# Patient Record
Sex: Female | Born: 1995 | Race: Black or African American | Hispanic: No | Marital: Single | Smoking: Never smoker
Health system: Southern US, Community
[De-identification: ages and names within clinical notes are randomized; demographics above are authoritative.]

## PROBLEM LIST (undated history)

## (undated) DIAGNOSIS — K011 Impacted teeth: Secondary | ICD-10-CM

## (undated) DIAGNOSIS — R519 Headache, unspecified: Secondary | ICD-10-CM

## (undated) DIAGNOSIS — F429 Obsessive-compulsive disorder, unspecified: Secondary | ICD-10-CM

## (undated) DIAGNOSIS — Z973 Presence of spectacles and contact lenses: Secondary | ICD-10-CM

## (undated) DIAGNOSIS — I10 Essential (primary) hypertension: Secondary | ICD-10-CM

## (undated) DIAGNOSIS — D649 Anemia, unspecified: Secondary | ICD-10-CM

## (undated) DIAGNOSIS — F319 Bipolar disorder, unspecified: Secondary | ICD-10-CM

## (undated) DIAGNOSIS — F952 Tourette's disorder: Secondary | ICD-10-CM

## (undated) DIAGNOSIS — IMO0002 Reserved for concepts with insufficient information to code with codable children: Secondary | ICD-10-CM

## (undated) DIAGNOSIS — IMO0001 Reserved for inherently not codable concepts without codable children: Secondary | ICD-10-CM

## (undated) DIAGNOSIS — R200 Anesthesia of skin: Secondary | ICD-10-CM

## (undated) DIAGNOSIS — N920 Excessive and frequent menstruation with regular cycle: Secondary | ICD-10-CM

## (undated) DIAGNOSIS — E119 Type 2 diabetes mellitus without complications: Secondary | ICD-10-CM

## (undated) DIAGNOSIS — Z6841 Body Mass Index (BMI) 40.0 and over, adult: Secondary | ICD-10-CM

## (undated) DIAGNOSIS — R202 Paresthesia of skin: Secondary | ICD-10-CM

## (undated) DIAGNOSIS — K029 Dental caries, unspecified: Secondary | ICD-10-CM

## (undated) DIAGNOSIS — R51 Headache: Secondary | ICD-10-CM

---
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
## 2014-11-27 ENCOUNTER — Emergency Department (HOSPITAL_COMMUNITY)
Admission: EM | Admit: 2014-11-27 | Discharge: 2014-11-27 | Disposition: A | Discharge status: Home / Self Care | Payer: Medicaid Other | Attending: Emergency Medicine | Admitting: Emergency Medicine

## 2014-11-27 ENCOUNTER — Encounter (HOSPITAL_COMMUNITY): Payer: Self-pay | Admitting: Family Medicine

## 2014-11-27 DIAGNOSIS — Z8742 Personal history of other diseases of the female genital tract: Secondary | ICD-10-CM | POA: Diagnosis not present

## 2014-11-27 DIAGNOSIS — E6609 Other obesity due to excess calories: Secondary | ICD-10-CM | POA: Insufficient documentation

## 2014-11-27 DIAGNOSIS — Z862 Personal history of diseases of the blood and blood-forming organs and certain disorders involving the immune mechanism: Secondary | ICD-10-CM | POA: Diagnosis not present

## 2014-11-27 DIAGNOSIS — M5416 Radiculopathy, lumbar region: Secondary | ICD-10-CM | POA: Diagnosis not present

## 2014-11-27 DIAGNOSIS — M545 Low back pain: Secondary | ICD-10-CM | POA: Diagnosis present

## 2014-11-27 MED ORDER — OXYCODONE-ACETAMINOPHEN 5-325 MG PO TABS
1.0000 | ORAL_TABLET | Freq: Once | ORAL | Status: AC
  Administered 2014-11-27: 1 via ORAL

## 2014-11-27 MED ORDER — PREDNISONE 50 MG PO TABS: ORAL_TABLET | ORAL | Status: DC

## 2014-11-27 MED ORDER — KETOROLAC TROMETHAMINE 30 MG/ML IJ SOLN
30.0000 mg | Freq: Once | INTRAMUSCULAR | Status: AC
  Administered 2014-11-27: 30 mg via INTRAVENOUS
  Filled 2014-11-27: qty 1

## 2014-11-27 MED ORDER — METHYLPREDNISOLONE SODIUM SUCC 125 MG IJ SOLR
125.0000 mg | Freq: Once | INTRAMUSCULAR | Status: AC
  Administered 2014-11-27: 125 mg via INTRAVENOUS
  Filled 2014-11-27: qty 2

## 2014-11-27 MED ORDER — DIAZEPAM 5 MG PO TABS: 5.0000 mg | ORAL_TABLET | Freq: Four times a day (QID) | ORAL | Status: DC | PRN

## 2014-11-27 MED ORDER — DIAZEPAM 5 MG/ML IJ SOLN
5.0000 mg | Freq: Once | INTRAMUSCULAR | Status: AC
  Administered 2014-11-27: 5 mg via INTRAVENOUS
  Filled 2014-11-27: qty 2

## 2014-11-27 MED ORDER — OXYCODONE-ACETAMINOPHEN 5-325 MG PO TABS
ORAL_TABLET | ORAL | Status: AC
  Filled 2014-11-27: qty 1

## 2014-11-27 MED ORDER — MORPHINE SULFATE (PF) 4 MG/ML IV SOLN
4.0000 mg | Freq: Once | INTRAVENOUS | Status: AC
  Administered 2014-11-27: 4 mg via INTRAVENOUS
  Filled 2014-11-27: qty 1

## 2014-11-27 MED ORDER — OXYCODONE-ACETAMINOPHEN 5-325 MG PO TABS: ORAL_TABLET | ORAL | Status: DC

## 2014-11-27 NOTE — Discharge Instructions (Signed)
Please take ibuprofen  (this is normally 2 over the counter pills) every 6 hours (take with food to minimze stomach irritation).   Take valium and/or percocet for breakthrough pain, do not drink alcohol, drive, care for children or perfom other critical tasks while taking valium and/or percocet.  Do not hesitate to return to the emergency room for any new, worsening or concerning symptoms.  Please obtain primary care using resource guide below. Let them know that you were seen in the emergency room and that they will need to obtain records for further outpatient management.    Emergency Department Resource Guide 1) Find a Doctor and Pay Out of Pocket Although you won't have to find out who is covered by your insurance plan, it is a good idea to ask around and get recommendations. You will then need to call the office and see if the doctor you have chosen will accept you as a new patient and what types of options they offer for patients who are self-pay. Some doctors offer discounts or will set up payment plans for their patients who do not have insurance, but you will need to ask so you aren't surprised when you get to your appointment.  2) Contact Your Local Health Department Not all health departments have doctors that can see patients for sick visits, but many do, so it is worth a call to see if yours does. If you don't know where your local health department is, you can check in your phone book. The CDC also has a tool to help you locate your state's health department, and many state websites also have listings of all of their local health departments.  3) Find a Walk-in Clinic If your illness is not likely to be very severe or complicated, you may want to try a walk in clinic. These are popping up all over the country in pharmacies, drugstores, and shopping centers. They're usually staffed by nurse practitioners or physician assistants that have been trained to treat common illnesses and  complaints. They're usually fairly quick and inexpensive. However, if you have serious medical issues or chronic medical problems, these are probably not your best option.  No Primary Care Doctor: - Call Health Connect at  [phone] - they can help you locate a primary care doctor that  accepts your insurance, provides certain services, etc. - Physician Referral Service- [phone]  Chronic Pain Problems: Organization         Address  Phone   Notes  [facility]  [phone] Patients need to be referred by their primary care doctor.   Medication Assistance: Organization         Address  Phone   Notes  [facility] [address] [place], [place] [address] [phone] --Must be a resident of [facility] -- Must have NO insurance coverage whatsoever (no Medicaid/ Medicare, etc.) -- The pt. MUST have a primary care doctor that directs their care regularly and follows them in the community   [facility]  [phone]   [employer]  [phone]    Agencies that provide inexpensive medical care: Organization         Address  Phone   Notes  [facility]  [phone]   [facility]    [phone]   [facility] [address] [place], [place] [address] [phone]   [facility] [address] [place]  [address], [place] [phone]   Planned Parenthood    [phone]   [facility]    [phone]   [place] and [facility]  [address], [place] Phone:  [phone], Fax:  [phone] Hours of Operation:  9 am - 6 pm, M-F.  Also accepts Medicaid/Medicare and self-pay.  [facility] for [place], [address], [place] Phone: [phone], Fax: [phone]. Hours of Operation:  8:30 am - 5:30 pm, M-F.  Also accepts Medicaid and self-pay.  [facility] [address], [place] Phone: [phone]   [place], [place], [place] [phone], Ext. [phone] Mondays & Thursdays: 7-9 AM.  First 15 patients are seen on a first come, first serve basis.    [place] Providers:  Organization         Address  Phone   Notes  [facility] [address], [place] [phone] Also accepts self-pay patients.  [facility] [id number] [place], [place]  [phone]   [place], [address], [place] [phone]   [facility] [address] [place] [phone]   Lucianne Lei [address], [place]   [phone] Only accepts [place] Access [place] patients after they have their name applied to their card.   Self-Pay (no insurance) in [facility]:  Organization         Address  Phone   Notes  Sickle Cell Patients, [facility] [place] [phone]   [facility] [place] [phone]   [facility]  [place], [place], [place] [phone]   [facility]/Dr. Osei-Bonsu  [address], [place] or [address], [place] [phone] Phone number for both [place] and [place] locations is the same.  [facility] [address], [address] [phone]   [facility] [address] [place] or [address] [phone]   [facility] [address] [place] [phone], phone; [phone], fax Sees patients 1st and 3rd Saturday of every month.  Must not qualify for public or private insurance (i.e. Medicaid, Medicare, [place] Health Choice, Veterans' Benefits)  Household income should be no more than 200% of the poverty level  The clinic cannot treat you if you are pregnant or think you are pregnant  Sexually transmitted diseases are not treated at the clinic.    Dental Care: Organization         Address  Phone  Notes  [facility] [place] [phone] Accepts children up to age 42 who are enrolled in [place] or [place]; pregnant women with a Medicaid card; and children who have applied for Medicaid or [place] Health Choice, but were declined, whose parents can pay a reduced fee at time of service.  [facility]  [address], [place] [phone] Accepts children up to age 78 who are enrolled in [place] or [place]; pregnant women with a Medicaid card; and children who have applied for Medicaid or  [place] Health Choice, but were declined, whose parents can pay a reduced fee at time of service.  [place] Adult Dental Access PROGRAM  [place] [phone] Patients are seen by appointment only. Walk-ins are not accepted. [place] will see patients 31 years of age and older. Monday - Tuesday (8am-5pm) Most Wednesdays (8:30-5pm) $30 per visit, cash only  [facility] Adult Dental Access PROGRAM  [address], [facility] [phone] Patients are seen by appointment only. Walk-ins are not accepted. [place] will see patients 60 years of age and older. One Wednesday Evening (Monthly: Volunteer Based).  $30 per visit, cash only  [place]  [phone] for adults; Children under age 1, call Graduate Pediatric Dentistry at [phone]. Children aged 26-14, please call [phone] to request a pediatric application.  Dental services are provided in all areas of dental care including fillings, crowns and bridges, complete and partial dentures, implants, gum treatment, root canals, and extractions. Preventive care is  also provided. Treatment is provided to both adults and children. Patients are selected via a lottery and there is often a waiting list.   [facility] [address], [place]  [phone] [email or web address]   [facility] [address] [place], [place] [phone], Ext. [phone] Second and Fourth Thursday of each month, opens at 6:30 AM; Clinic ends at 9 AM.  Patients are seen on a first-come first-served basis, and a limited number are seen during each clinic.   [facility]  [address] Hillard Danker [place], [place] [phone]   Eligibility Requirements You must have lived in [place], [place], or [place] counties for at least the last three months.   You cannot be eligible for state or federal sponsored [employer], including [employer], [place], or [employer].   You generally cannot be eligible for healthcare insurance through your employer.    How to apply: Eligibility screenings are held every Tuesday and Wednesday afternoon from 1:00 pm until 4:00 pm. You do not need an appointment for the interview!  [facility] [address], [place], [place]   [place]  [place] Department  [place]  [phone]    Behavioral Health Resources in the Community: Intensive Outpatient Programs Organization         Address  Phone  Notes  [place] [facility] [place]. [address], [place], [place] [phone]   [facility] [address] [place], [place]   ADS: [facility] [address] [place], [place]   [place] [address],  [place], [place] or [phone]   Substance Abuse Resources Organization         Address  Phone  Notes  Alcohol and Drug Services  [phone]   [place]  [phone]   [facility] [place]   Chinita Pester  [phone]   Residential & Outpatient Substance Abuse Program  [phone]   Psychological Services Organization         Address  Phone  Notes  [facility]  [place]  [phone]   [place] [address] [place] or [phone]    Mobile Crisis Teams Organization         Address  Phone  Notes  [facility], Mobile Crisis Care Unit  [phone]   [facility]  [address] [place], [place]   [facility] [address] [place] [phone]    Self-Help/Support Groups Organization         Address  Phone             Notes  [facility] [place] - variety of support groups  [place] Call for more information  Narcotics Anonymous (NA), [facility] [address], [employer] [place]  2 meetings at this location   Special educational needs teacher         Address  Phone  Notes  [facility] [place],    [place]  [phone]   [facility]  [address] [place] [id number], [place], [place]   [place], [place] [phone] Admissions: 8am-3pm M-F  Incentives Substance [place] [address]    [place], [place] [phone]   [facility] [address] [place], [place], [place]   [facility] [address],  [place], [place]   [facility] - Intensive Outpatient [address], [place], [place]   [facility] ([place].) [place].,  [place], [place] [phone] or [phone]   [facility] Services (RTS) [address], [place], [place] Accepts Medicaid  [facility] [place] [address],  [place] [phone]  Substance Abuse/Addiction Treatment   [facility] Organization         Address  Phone  Notes  [facility]  [phone]   Domenic Schwab, PhD [address] Arlis [place], [place]   [phone] or [phone]   [place], [place] [phone]   [facility] [address] [place], [place] [phone] Insurance/Medicaid/sponsorship through [facility] and Families [address], [address] [place]                                    [place], [place] [phone] [place] [address], [place] [phone]    Dr. Adele Schilder  [phone]   [facility] [place] Dept. 1) [address] [place] 2) [place] 3)  [place] [address], [place] [phone]  [phone]   [place] [phone] or [phone] (After Hours)

## 2014-11-27 NOTE — ED Notes (Signed)
Pt here for right lower back pain radiating down her right leg. sts hx of same and multiple falls lately.

## 2014-11-27 NOTE — ED Provider Notes (Signed)
CSN: [id number]     Arrival date & time 11/27/14  1443 History  By signing my name below, I, Carolyn Ingram, attest that this documentation has been prepared under the direction and in the presence of [employer], PA-C. Electronically Signed: Placido Ingram, ED Scribe. 11/27/2014. 3:11 PM.   Chief Complaint  Patient presents with  . Back Pain  . Leg Pain   The history is provided by the patient. No language interpreter was used.    HPI Comments: Carolyn Ingram is a 19 y.o. female with a hx of obesity who presents to the Emergency Department complaining of constant, moderate, right lower back pain with onset 1 week ago. Pt describes the pain as a "pinching", notes it radiates down her right leg into her right groin, says that it worsens when standing and further notes difficulty ambulating since onset. Pt presents in a diaper and notes wearing it to prevent worsening pain in the region when standing and denies incontinence of any kind. She notes taking NSAIDs for pain management which have provided little relief. Pt notes multiple falls with her most recent occurrence being 1 month ago. She notes a hx of similar symptoms and denies having seen her PCP due to waiting for her MEDICAID coverage to take effect. She notes cold like symptoms 1 week ago which have since alleviated. Pt denies any hx of IVDA or CA and denies current DM although she had it as a child. She denies any possibility of being pregnant. Pt denies driving herself to the ED today. She denies any incontinence of her bowels or bladder or any constipation.  PCP: Dr. Mayford Knife  Past Medical History  Diagnosis Date  . Anemia   . Menorrhagia    History reviewed. No pertinent past surgical history. Family History  Problem Relation Age of Onset  . Cancer Mother   . Diabetes Mother   . Hypertension Mother   . Cancer Father   . Diabetes Father   . Hypertension Father    Social History  Substance Use Topics  . Smoking status:  Never Smoker   . Smokeless tobacco: Never Used  . Alcohol Use: No   OB History    Gravida Para Term Preterm AB TAB SAB Ectopic Multiple Living   0              Review of Systems A complete 10 system review of systems was obtained and all systems are negative except as noted in the HPI and PMH.   Allergies  Review of patient's allergies indicates no known allergies.  Home Medications   Prior to Admission medications   Medication Sig Start Date End Date Taking? Authorizing Provider  diazepam (VALIUM) 5 MG tablet Take 1 tablet (5 mg total) by mouth every 6 (six) hours as needed for muscle spasms. 11/27/14   Aeralyn Barna, PA-C  meclizine (ANTIVERT) 25 MG tablet Take 1 tablet (25 mg total) by mouth 3 (three) times daily as needed for dizziness or nausea. 06/17/12   Quita Skye, MD  oxyCODONE-acetaminophen (PERCOCET/ROXICET) 5-325 MG tablet 1 to 2 tabs PO q6hrs  PRN for pain 11/27/14   Joni Reining Gaylen Pereira, PA-C  predniSONE (DELTASONE) 50 MG tablet Take 1 tablet daily with breakfast 11/27/14   Joni Reining Daven Montz, PA-C   BP 122/85 mmHg  Pulse 85  Temp(Src) 98.3 F (36.8 C)  Resp 18  Wt 336 lb (152.409 kg)  SpO2 97% Physical Exam  Constitutional: She is oriented to person, place, and time. She appears  well-developed and well-nourished.  Morbidly obese, appears acutely uncomfortable. Patient is sitting in such a way that she is not putting any pressure on the right gluteus.  HENT:  Head: Normocephalic and atraumatic.  Mouth/Throat: No oropharyngeal exudate.  Neck: Normal range of motion. No tracheal deviation present.  Cardiovascular: Normal rate, regular rhythm and intact distal pulses.   Pulmonary/Chest: Effort normal and breath sounds normal. No respiratory distress. She has no wheezes. She has no rales. She exhibits no tenderness.  Abdominal: Soft. Bowel sounds are normal. There is no tenderness.  Musculoskeletal: Normal range of motion.  Neurological: She is alert and oriented to  person, place, and time.  No point tenderness to percussion of lumbar spinal processes.  No TTP or paraspinal muscular spasm. Strength is 5 out of 5 to bilateral lower extremities at hip and knee; extensor hallucis longus 5 out of 5. Ankle strength 5 out of 5, no clonus, neurovascularly intact. No saddle anaesthesia. Patellar reflexes are 2+ bilaterally.   Normal rectal tone.    Straight leg raise is positive on the right side at 15, positive on the left side at 35.   Skin: Skin is warm and dry. She is not diaphoretic.  Psychiatric: She has a normal mood and affect. Her behavior is normal.  Nursing note and vitals reviewed.  ED Course  Procedures  DIAGNOSTIC STUDIES: Oxygen Saturation is 97% on RA, normal by my interpretation.    COORDINATION OF CARE: 3:10 PM Discussed treatment plan with pt at bedside including IV pain medications and a referral to an orthopaedic specialist. Pt agreed to plan.  Labs Review Labs Reviewed - No data to display  Imaging Review No results found.   EKG Interpretation None      MDM   Final diagnoses:  Lumbar radiculopathy, acute    Filed Vitals:   11/27/14 1455  BP: 122/85  Pulse: 85  Temp: 98.3 F (36.8 C)  Resp: 18  Weight: 336 lb (152.409 kg)  SpO2: 97%    Medications  oxyCODONE-acetaminophen (PERCOCET/ROXICET) 5-325 MG per tablet 1 tablet (1 tablet Oral Given 11/27/14 1458)  methylPREDNISolone sodium succinate (SOLU-MEDROL) 125 mg/2 mL injection 125 mg (125 mg Intravenous Given 11/27/14 1529)  diazepam (VALIUM) injection 5 mg (5 mg Intravenous Given 11/27/14 1529)  ketorolac (TORADOL) 30 MG/ML injection 30 mg (30 mg Intravenous Given 11/27/14 1529)  morphine 4 MG/ML injection 4 mg (4 mg Intravenous Given 11/27/14 1529)    Carolyn Ingram is a pleasant 19 y.o. female presenting with severe lumbar radiculopathy. Neuro exam is nonfocal, doubt cauda equina, spinal epidural abscess or any other acute conditions. Triage initiated Percocet  given before my evaluation of this patient. I feel she will need stronger pain medication. Patient given IV Valium, Toradol, Solu-Medrol and morphine.  After given some time for the pain medication to kick and she reports she feels better. Patient is ambulatory. Ortho referral given.   Evaluation does not show pathology that would require ongoing emergent intervention or inpatient treatment. Pt is hemodynamically stable and mentating appropriately. Discussed findings and plan with patient/guardian, who agrees with care plan. All questions answered. Return precautions discussed and outpatient follow up given.   New Prescriptions   DIAZEPAM (VALIUM) 5 MG TABLET    Take 1 tablet (5 mg total) by mouth every 6 (six) hours as needed for muscle spasms.   OXYCODONE-ACETAMINOPHEN (PERCOCET/ROXICET) 5-325 MG TABLET    1 to 2 tabs PO q6hrs  PRN for pain   PREDNISONE (DELTASONE) 50  MG TABLET    Take 1 tablet daily with breakfast     I personally performed the services described in this documentation, which was scribed in my presence. The recorded information has been reviewed and is accurate.    Wynetta Emery, PA-C 11/27/14 1736  Mancel Bale, MD 11/30/14 [phone]

## 2014-11-27 NOTE — ED Notes (Signed)
Pt was able to ambulate a few steps in her room without assistance.

## 2014-11-28 ENCOUNTER — Emergency Department (HOSPITAL_COMMUNITY)
Admission: EM | Admit: 2014-11-28 | Discharge: 2014-11-29 | Disposition: A | Discharge status: Home / Self Care | Payer: Medicaid Other | Attending: Emergency Medicine | Admitting: Emergency Medicine

## 2014-11-28 ENCOUNTER — Encounter (HOSPITAL_COMMUNITY): Payer: Self-pay | Admitting: Emergency Medicine

## 2014-11-28 DIAGNOSIS — Y998 Other external cause status: Secondary | ICD-10-CM | POA: Insufficient documentation

## 2014-11-28 DIAGNOSIS — M5416 Radiculopathy, lumbar region: Secondary | ICD-10-CM

## 2014-11-28 DIAGNOSIS — Y9389 Activity, other specified: Secondary | ICD-10-CM | POA: Insufficient documentation

## 2014-11-28 DIAGNOSIS — S3992XA Unspecified injury of lower back, initial encounter: Secondary | ICD-10-CM | POA: Insufficient documentation

## 2014-11-28 DIAGNOSIS — Y9289 Other specified places as the place of occurrence of the external cause: Secondary | ICD-10-CM | POA: Insufficient documentation

## 2014-11-28 DIAGNOSIS — Z862 Personal history of diseases of the blood and blood-forming organs and certain disorders involving the immune mechanism: Secondary | ICD-10-CM | POA: Diagnosis not present

## 2014-11-28 DIAGNOSIS — Z3202 Encounter for pregnancy test, result negative: Secondary | ICD-10-CM | POA: Diagnosis not present

## 2014-11-28 DIAGNOSIS — S79911A Unspecified injury of right hip, initial encounter: Secondary | ICD-10-CM | POA: Diagnosis not present

## 2014-11-28 DIAGNOSIS — W108XXA Fall (on) (from) other stairs and steps, initial encounter: Secondary | ICD-10-CM | POA: Insufficient documentation

## 2014-11-28 DIAGNOSIS — Z8742 Personal history of other diseases of the female genital tract: Secondary | ICD-10-CM | POA: Diagnosis not present

## 2014-11-28 MED ORDER — HYDROMORPHONE HCL 1 MG/ML IJ SOLN
1.0000 mg | Freq: Once | INTRAMUSCULAR | Status: AC
Start: 2014-11-28 — End: 2014-11-28
  Administered 2014-11-28: 1 mg via INTRAVENOUS
  Filled 2014-11-28: qty 1

## 2014-11-28 MED ORDER — CYCLOBENZAPRINE HCL 10 MG PO TABS
5.0000 mg | ORAL_TABLET | Freq: Once | ORAL | Status: AC
  Administered 2014-11-28: 5 mg via ORAL
  Filled 2014-11-28: qty 1

## 2014-11-28 NOTE — ED Notes (Addendum)
Patient arrived via [facility] from triage.  Patient stated she has been falling a lot like her right leg just "gives away".  Tonight PTA she fell down 3 step and stated she landed in a split position.  C/o right hip pain.  Assisted her into the bed.  States she sleeps with a pillow between her legs at home anyway. Stated I would need to start an IV but she stated that she did not want anything for pain just wants to know what is going on.  Unable to have her lay flat to check for shortening or rotation.

## 2014-11-28 NOTE — ED Notes (Signed)
Pt from home for eval of ongoing hip pain and back pain with radiation to right leg. Pt states was sent home with prednisone and pain meds but has had no relief. Denies any urinary or bowel incontinence. Pt uncomfortable in triage. States she was told to have MRI done if pain continued.

## 2014-11-28 NOTE — ED Provider Notes (Signed)
CSN: [id number]     Arrival date & time 11/28/14  2118 History  By signing my name below, I, Emmanuella Mensah, attest that this documentation has been prepared under the direction and in the presence of Blake Divine, MD. Electronically Signed: Angelene Giovanni, ED Scribe. 11/28/2014. 11:38 PM.    Chief Complaint  Patient presents with  . Hip Pain  . Leg Pain   Patient is a 19 y.o. female presenting with hip pain and leg pain. The history is provided by the patient. No language interpreter was used.  Hip Pain This is a recurrent problem. The current episode started more than 1 week ago. The problem occurs constantly. The problem has been gradually worsening. Pertinent negatives include no chest pain, no abdominal pain, no headaches and no shortness of breath. The symptoms are aggravated by walking, twisting, exertion and standing. The symptoms are relieved by medications.  Leg Pain Location:  Hip and leg Time since incident:  8 hours Injury: yes   Mechanism of injury: fall   Fall:    Fall occurred:  Down stairs   Height of fall:  3 steps   Impact surface:  [employer] of impact:  Unable to specify   Entrapped after fall: no   Hip location:  R hip Leg location:  R leg Pain details:    Quality:  Unable to specify (Pt teary upon touch )   Radiates to:  Does not radiate   Severity:  Moderate   Onset quality:  Gradual   Duration:  1 week   Timing:  Constant   Progression:  Worsening Chronicity:  Recurrent Dislocation: no   Foreign body present:  No foreign bodies Prior injury to area:  Yes Worsened by:  Activity and bearing weight Associated symptoms: back pain, decreased ROM and muscle weakness   Risk factors: obesity    HPI Comments: Carolyn Ingram is a 19 y.o. female who presents to the Emergency Department status post fall that occurred today when she fell down 3 steps. Pt is teary as she reports associated constant, moderate gradually worsening right hip and right leg  pain. She also reports right knee pain and tingling on her right foot, with constipation with no urination onset yesterday. She adds that she had dark urine the 2 days ago. She denies any loss of bladder/bowel control or numbness in her pelvis area. She explains that she falls as if "her leg gives out on her" and her leg is weak. She reports that her symptoms started out as back pain 2 months ago and developed into leg pain a couple of weeks ago. Her mother adds that she may have sciatic nerve damage. Pt reports that she has been compliant with her medication that she received last night but states no relief. She reports that she had pain relief after her steroid vaccine here in the ED yesterday but once the medication wore off, she had increase pain.   Past Medical History  Diagnosis Date  . Anemia   . Menorrhagia    History reviewed. No pertinent past surgical history. Family History  Problem Relation Age of Onset  . Cancer Mother   . Diabetes Mother   . Hypertension Mother   . Cancer Father   . Diabetes Father   . Hypertension Father    Social History  Substance Use Topics  . Smoking status: Never Smoker   . Smokeless tobacco: Never Used  . Alcohol Use: No   OB History  Gravida Para Term Preterm AB TAB SAB Ectopic Multiple Living   0              Review of Systems  Respiratory: Negative for shortness of breath.   Cardiovascular: Negative for chest pain.  Gastrointestinal: Negative for abdominal pain.  Musculoskeletal: Positive for myalgias, back pain and arthralgias.  Neurological: Negative for headaches.  All other systems reviewed and are negative.     Allergies  Review of patient's allergies indicates no known allergies.  Home Medications   Prior to Admission medications   Medication Sig Start Date End Date Taking? Authorizing Provider  diazepam (VALIUM) 5 MG tablet Take 1 tablet (5 mg total) by mouth every 6 (six) hours as needed for muscle spasms. 11/27/14  Yes  Nicole Pisciotta, PA-C  oxyCODONE-acetaminophen (PERCOCET/ROXICET) 5-325 MG tablet 1 to 2 tabs PO q6hrs  PRN for pain Patient taking differently: Take 1-2 tablets by mouth every 6 (six) hours as needed for moderate pain.  11/27/14  Yes Nicole Pisciotta, PA-C  predniSONE (DELTASONE) 50 MG tablet Take 1 tablet daily with breakfast Patient taking differently: Take 50 mg by mouth daily with breakfast.  11/27/14  Yes Nicole Pisciotta, PA-C   BP 144/97 mmHg  Pulse 118  Temp(Src) 97.5 F (36.4 C) (Oral)  Resp 16  Ht  (1.676 m)  Wt 336 lb (152.409 kg)  BMI 54.26 kg/m2  SpO2 96% Physical Exam  Constitutional: She is oriented to person, place, and time. She appears well-developed and well-nourished. She appears distressed (appears very uncomfortable).  HENT:  Head: Normocephalic and atraumatic.  Eyes: Conjunctivae are normal. No scleral icterus.  Neck: Neck supple.  Cardiovascular: Normal rate and intact distal pulses.   Pulmonary/Chest: Effort normal. No stridor. No respiratory distress.  Abdominal: Normal appearance. She exhibits no distension.  Musculoskeletal:       Right hip: She exhibits decreased range of motion and tenderness.       Back:       Legs: Neurological: She is alert and oriented to person, place, and time. No sensory deficit.  Decreased strength in right foot compared to left, although exam severely limited by patient's pain.    Skin: Skin is warm and dry. No rash noted.  Psychiatric: She has a normal mood and affect. Her behavior is normal.  Nursing note and vitals reviewed.   ED Course  Procedures (including critical care time) DIAGNOSTIC STUDIES: Oxygen Saturation is 96% on RA, adequate by my interpretation.    COORDINATION OF CARE: 11:26 PM- Pt advised of plan for treatment and pt agrees.   Labs Review Labs Reviewed  URINALYSIS, ROUTINE W REFLEX MICROSCOPIC (NOT AT [facility]) - Abnormal; Notable for the following:    Specific Gravity, Urine 1.038 (*)     Glucose, UA >1000 (*)    Ketones, ur 15 (*)    All other components within normal limits  URINE MICROSCOPIC-ADD ON  POC URINE PREG, ED    Blake Divine, MD has personally reviewed and evaluated these lab results as part of his medical decision-making.   EKG Interpretation None      MDM   Final diagnoses:  Right lumbar radiculopathy    19 yo female with severe right low back pain radiating to right leg.  She also reports multiple falls over the past few months, including one today.  Her initial exam was limited due to severe pain.  However, after pain medications, her exam was much more reassuring.  She had good strength and  sensation in her legs, had 2+ bilateral patellar reflexes, and was able to stand and ambulate.  After pain meds, her symptoms appeared to originate in right low back. She also denied incontinence, fevers, perineal numbness.  Plain films obtained due to fall were negative.  I don't think she needs MR imaging tonight, but advised her to follow up with a spine specialist.    I personally performed the services described in this documentation, which was scribed in my presence. The recorded information has been reviewed and is accurate.     Blake Divine, MD 11/29/14 [phone]

## 2014-11-29 ENCOUNTER — Emergency Department (HOSPITAL_COMMUNITY): Payer: Medicaid Other

## 2014-11-29 LAB — URINE MICROSCOPIC-ADD ON

## 2014-11-29 LAB — URINALYSIS, ROUTINE W REFLEX MICROSCOPIC
BILIRUBIN URINE: NEGATIVE
HGB URINE DIPSTICK: NEGATIVE
Ketones, ur: 15 mg/dL — AB
Leukocytes, UA: NEGATIVE
Nitrite: NEGATIVE
PH: 5.5 (ref 5.0–8.0)
Protein, ur: NEGATIVE mg/dL
SPECIFIC GRAVITY, URINE: 1.038 — AB (ref 1.005–1.030)
UROBILINOGEN UA: 0.2 mg/dL (ref 0.0–1.0)

## 2014-11-29 LAB — POC URINE PREG, ED: Preg Test, Ur: NEGATIVE

## 2014-11-29 MED ORDER — OXYCODONE-ACETAMINOPHEN 5-325 MG PO TABS: 1.0000 | ORAL_TABLET | Freq: Four times a day (QID) | ORAL | Status: DC | PRN

## 2014-11-29 MED ORDER — HYDROMORPHONE HCL 1 MG/ML IJ SOLN
1.0000 mg | Freq: Once | INTRAMUSCULAR | Status: AC
Start: 2014-11-29 — End: 2014-11-29
  Administered 2014-11-29: 1 mg via INTRAVENOUS
  Filled 2014-11-29: qty 1

## 2014-11-29 MED ORDER — SODIUM CHLORIDE 0.9 % IV BOLUS (SEPSIS)
1000.0000 mL | Freq: Once | INTRAVENOUS | Status: AC
  Administered 2014-11-29: 1000 mL via INTRAVENOUS

## 2014-11-29 MED ORDER — HYDROMORPHONE HCL 1 MG/ML IJ SOLN
1.0000 mg | Freq: Once | INTRAMUSCULAR | Status: AC
  Administered 2014-11-29: 1 mg via INTRAVENOUS

## 2014-11-29 MED ORDER — HYDROMORPHONE HCL 1 MG/ML IJ SOLN
INTRAMUSCULAR | Status: AC
  Filled 2014-11-29: qty 1

## 2014-11-29 NOTE — ED Notes (Signed)
Pt ambulatory in hallway with two RNs; steady gait noted; pt appears to favor R leg during ambulation

## 2014-12-15 ENCOUNTER — Other Ambulatory Visit: Payer: Self-pay | Admitting: Neurosurgery

## 2014-12-15 DIAGNOSIS — M5416 Radiculopathy, lumbar region: Secondary | ICD-10-CM

## 2014-12-16 ENCOUNTER — Other Ambulatory Visit: Payer: Medicaid Other

## 2014-12-20 ENCOUNTER — Other Ambulatory Visit: Payer: Medicaid Other

## 2014-12-23 ENCOUNTER — Ambulatory Visit
Admission: RE | Admit: 2014-12-23 | Discharge: 2014-12-23 | Disposition: A | Discharge status: Home / Self Care | Payer: Medicaid Other | Source: Ambulatory Visit | Attending: Neurosurgery | Admitting: Neurosurgery

## 2014-12-23 DIAGNOSIS — M5416 Radiculopathy, lumbar region: Secondary | ICD-10-CM

## 2015-01-14 ENCOUNTER — Other Ambulatory Visit (HOSPITAL_COMMUNITY): Payer: Self-pay | Admitting: Neurosurgery

## 2015-01-29 NOTE — Pre-Procedure Instructions (Signed)
    Carolyn Ingram  01/29/2015      [facility] [id number] [place], [place] - [address] AT [facility] [address] [place] [id number] Phone: [phone] Fax: [phone]    Your procedure is scheduled on Monday, February 08, 2015  [facility] Ad[facility] at 9:30 A.M.  Call this number if you have problems the morning of surgery:  [phone]   Remember:  Do not eat food or drink liquids after midnight Sunday, February 07, 2015  Take these medicines the morning of surgery with A SIP OF WATER: None  Stop taking Aspirin, vitamins, fish oil, and herbal medications. Do not take any NSAIDs ie: Ibuprofen, Advil, Naproxen or any medication containing Aspirin ; stop now.   Do not wear jewelry, make-up or nail polish.  Do not wear lotions, powders, or perfumes.  You may not wear deodorant.  Do not shave 48 hours prior to surgery.    Do not bring valuables to the hospital.  [facility] is not responsible for any belongings or valuables.  Contacts, dentures or bridgework may not be worn into surgery.  Leave your suitcase in the car.  After surgery it may be brought to your room.  For patients admitted to the hospital, discharge time will be determined by your treatment team.  Patients discharged the day of surgery will not be allowed to drive home.   Name and phone number of your driver:   Special instructions: Shower the night before surgery and the morning of surgery with CHG.  Please read over the following fact sheets that you were given. Pain Booklet, Coughing and Deep Breathing, MRSA Information and Surgical Site Infection Prevention

## 2015-02-01 ENCOUNTER — Other Ambulatory Visit: Payer: Self-pay

## 2015-02-01 ENCOUNTER — Encounter (HOSPITAL_COMMUNITY)
Admission: RE | Admit: 2015-02-01 | Discharge: 2015-02-01 | Disposition: A | Discharge status: Home / Self Care | Payer: Medicaid Other | Source: Ambulatory Visit | Attending: Neurosurgery | Admitting: Neurosurgery

## 2015-02-01 ENCOUNTER — Encounter (HOSPITAL_COMMUNITY): Payer: Self-pay

## 2015-02-01 DIAGNOSIS — Z01818 Encounter for other preprocedural examination: Secondary | ICD-10-CM | POA: Diagnosis present

## 2015-02-01 DIAGNOSIS — Z01812 Encounter for preprocedural laboratory examination: Secondary | ICD-10-CM | POA: Insufficient documentation

## 2015-02-01 DIAGNOSIS — F952 Tourette's disorder: Secondary | ICD-10-CM | POA: Insufficient documentation

## 2015-02-01 DIAGNOSIS — R000 Tachycardia, unspecified: Secondary | ICD-10-CM | POA: Diagnosis not present

## 2015-02-01 DIAGNOSIS — Z79899 Other long term (current) drug therapy: Secondary | ICD-10-CM | POA: Insufficient documentation

## 2015-02-01 DIAGNOSIS — R739 Hyperglycemia, unspecified: Secondary | ICD-10-CM | POA: Diagnosis not present

## 2015-02-01 DIAGNOSIS — I10 Essential (primary) hypertension: Secondary | ICD-10-CM | POA: Insufficient documentation

## 2015-02-01 HISTORY — DX: Anesthesia of skin: R20.0

## 2015-02-01 HISTORY — DX: Presence of spectacles and contact lenses: Z97.3

## 2015-02-01 HISTORY — DX: Headache, unspecified: R51.9

## 2015-02-01 HISTORY — DX: Headache: R51

## 2015-02-01 HISTORY — DX: Anesthesia of skin: R20.2

## 2015-02-01 HISTORY — DX: Reserved for inherently not codable concepts without codable children: IMO0001

## 2015-02-01 HISTORY — DX: Obsessive-compulsive disorder, unspecified: F42.9

## 2015-02-01 HISTORY — DX: Reserved for concepts with insufficient information to code with codable children: IMO0002

## 2015-02-01 HISTORY — DX: Body Mass Index (BMI) 40.0 and over, adult: Z684

## 2015-02-01 HISTORY — DX: Morbid (severe) obesity due to excess calories: E66.01

## 2015-02-01 HISTORY — DX: Essential (primary) hypertension: I10

## 2015-02-01 HISTORY — DX: Bipolar disorder, unspecified: F31.9

## 2015-02-01 HISTORY — DX: Tourette's disorder: F95.2

## 2015-02-01 LAB — BASIC METABOLIC PANEL
ANION GAP: 10 (ref 5–15)
BUN: 5 mg/dL — AB (ref 6–20)
CALCIUM: 10.2 mg/dL (ref 8.9–10.3)
CO2: 27 mmol/L (ref 22–32)
Chloride: 100 mmol/L — ABNORMAL LOW (ref 101–111)
Creatinine, Ser: 0.65 mg/dL (ref 0.44–1.00)
GFR calc Af Amer: >60 mL/min (ref >60)
GLUCOSE: 294 mg/dL — AB (ref 65–99)
POTASSIUM: 3.9 mmol/L (ref 3.5–5.1)
SODIUM: 137 mmol/L (ref 135–145)

## 2015-02-01 LAB — CBC WITH DIFFERENTIAL/PLATELET
BASOS ABS: 0 10*3/uL (ref 0.0–0.1)
BASOS PCT: 0 %
EOS PCT: 1 %
Eosinophils Absolute: 0.1 10*3/uL (ref 0.0–0.7)
HCT: 42.6 % (ref 36.0–46.0)
Hemoglobin: 13.9 g/dL (ref 12.0–15.0)
LYMPHS PCT: 34 %
Lymphs Abs: 3.7 10*3/uL (ref 0.7–4.0)
MCH: 28.7 pg (ref 26.0–34.0)
MCHC: 32.6 g/dL (ref 30.0–36.0)
MCV: 88 fL (ref 78.0–100.0)
MONO ABS: 0.5 10*3/uL (ref 0.1–1.0)
Monocytes Relative: 5 %
Neutro Abs: 6.6 10*3/uL (ref 1.7–7.7)
Neutrophils Relative %: 60 %
PLATELETS: 326 10*3/uL (ref 150–400)
RBC: 4.84 MIL/uL (ref 3.87–5.11)
RDW: 13 % (ref 11.5–15.5)
WBC: 10.9 10*3/uL — AB (ref 4.0–10.5)

## 2015-02-01 LAB — HCG, SERUM, QUALITATIVE: PREG SERUM: NEGATIVE

## 2015-02-01 LAB — SURGICAL PCR SCREEN
MRSA, PCR: NEGATIVE
STAPHYLOCOCCUS AUREUS: NEGATIVE

## 2015-02-01 NOTE — Progress Notes (Addendum)
Anesthesia PAT Evaluation: Patient is a 19 year old female scheduled for L5-S1 microdiscectomy on 02/08/15 by Dr. Jordan LikesPool.  History includes non-smoker, anemia with menorrhagia (resolved after Depo injection), SOB, Bipolar disorder, OCD, Tourette's disorder, HTN, HNP with RLE N/T, headaches. BMI is 52.21 consistent with morbid obesity. PCP is listed as Dr. Lerry Linerwight Williams (newly established). Had been seeing GYN Dr. Antionette CharLisa Jackson-Moore prior to that.   She reported intermittent SOB at rest over the past year. Also developed intermittent sharp 8/10 left sided chest pains when lying down.  These occur up to once a week. They are sometimes associated with headaches and palpitations. No nausea, diaphoresis, syncope, edema. Pain starts as sharp and then gradually subsides after 10 minutes. Deep breathing seems to help. Pain doesn't seem associated with eating. She has felt stressed but no definite correlation with that either. She does not have exertional SOB or CP--and she is walks everyday for approximately 1 hour for weight loss efforts. Grandmother has a "bad heart" that is followed by cardiologist Dr. Sharyn LullHarwani, but she is unsure of the details. She does not think that she has had her thyroid checked. HR was up to 107 bpm today.  Meds: lisinopril.   PAT Vitals: BP 143/90, HR 102 bpm, T 36.8 C, 99% RA.  Patient is a pleasant black female in NAD. Heart regular rhythm, rate in low 100's. No murmur noted. Lungs clear. No pitting edema noted.   02/01/15 EKG: NSR, moderate voltage criteria for LVH, may be normal variant. Non-specific T wave abnormality (inferior leads). No comparison tracing. Denied prior cardiac studies.   Preoperative labs noted. Non-fasting glucose 294. Cr 0.65. Cl 100. WBC 10.9. H/H 13.9/42.6. Serum pregnancy test was negative.   Patient with mild tachycardia today and new non-exertional chest pains and palpitations over the past four months. Since chest pains are with lying down and not with  exertion, question whether or not she has some reflux or esophageal spasms. Tachycardiac also with multiple possible etiologies including morbid obesity, deconditioning, pain, thyroid disorder, infection, cardiopulmonary (although seems less likely since symptoms are not associated with activity, including walking for an hour). Blood glucose also elevated (had been on prednisone in early 11/2014, but no longer taking). She did not report a history of DM. I will attempt to add a hgbA1C to today's labs. With baseline tachycardia and hyperglycemia (suspicious for new onset DM2), she will need a pre-operative evaluation with Dr. Mayford KnifeWilliams. Anesthesiologist Dr. Krista BlueSinger is in agreement with this plan. I have left a message for patient to call me. I have notified Erie NoeVanessa at Dr. Mayford KnifeWilliams' office who will also try to contact patient and Dr. Mayford KnifeWilliams' office to help set up an appointment. I will fax currently available EKG, labs to Dr. Mayford KnifeWilliams for his review.   Carolyn Ochsllison Khalani Novoa, PA-C [facility]/Anesthesiology Phone [phone] 02/01/2015 4:25 PM  Addendum: A1C came back at 10.9. Result faxed to Dr. Mayford KnifeWilliams. I also called and discussed results with Carolyn Ingram and notified her that she would need to be evaluated at Dr. Mayford KnifeWilliams' office prior to surgery for her hyperglycemia, tachycardia, and the symptoms we discussed yesterday. Plans to proceed as scheduled will depend on PCP recommendations.  May need more time to work on getting what appears to be newly diagnosed DM2 under control. (She denied known history of DM or hyperglycemia, and has been off prednisone for ~ one month.) Voice message left with Erie NoeVanessa regarding A1C results and update.  Carolyn ChockAllison Seara Hinesley, PA-C [facility]  [facility]/Anesthesiology Phone [phone] 02/02/2015 11:29 AM  Addendum: Per Erie Noe at Dr. Lindalou Hose office. Patient has been medically cleared for surgery following visit 02/04/15. She will fax over clearance form once received  from Dr. Mayford Knife' office.  Carolyn Ingram [facility] Short Stay Center/Anesthesiology Phone [phone] 02/05/2015 11:26 AM

## 2015-02-01 NOTE — Progress Notes (Signed)
Pt stated that she has been experiencing SOB since November 2015, while sitting still. Pt also stated that she has been experiencing intermittent , left-sided chest pain , while lying down over the last two months. Pt denies being under the care of a cardiologist and having a stress test, echo and cardiac cath. Pt denies having a chest x ray and EKG within the last year. Revonda StandardAllison, PA, anesthesia, made aware of pt BMI, C/O SOB and chest pain. An EKG will be performed.

## 2015-02-02 LAB — HEMOGLOBIN A1C
HEMOGLOBIN A1C: 10.9 % — AB (ref 4.8–5.6)
Mean Plasma Glucose: 266 mg/dL

## 2015-02-07 MED ORDER — DEXTROSE 5 % IV SOLN
3.0000 g | INTRAVENOUS | Status: AC
  Administered 2015-02-08: 3 g via INTRAVENOUS
  Filled 2015-02-07: qty 3000

## 2015-02-08 ENCOUNTER — Observation Stay (HOSPITAL_COMMUNITY)
Admission: RE | Admit: 2015-02-08 | Discharge: 2015-02-09 | Disposition: A | Discharge status: Home / Self Care | Payer: Medicaid Other | Source: Ambulatory Visit | Attending: Neurosurgery | Admitting: Neurosurgery

## 2015-02-08 ENCOUNTER — Encounter (HOSPITAL_COMMUNITY)
Admission: RE | Disposition: A | Discharge status: Home / Self Care | Payer: Self-pay | Source: Ambulatory Visit | Attending: Neurosurgery

## 2015-02-08 ENCOUNTER — Ambulatory Visit (HOSPITAL_COMMUNITY): Payer: Medicaid Other | Admitting: Vascular Surgery

## 2015-02-08 ENCOUNTER — Encounter (HOSPITAL_COMMUNITY): Payer: Self-pay | Admitting: Certified Registered Nurse Anesthetist

## 2015-02-08 ENCOUNTER — Ambulatory Visit (HOSPITAL_COMMUNITY): Payer: Medicaid Other

## 2015-02-08 ENCOUNTER — Ambulatory Visit (HOSPITAL_COMMUNITY): Payer: Medicaid Other | Admitting: Anesthesiology

## 2015-02-08 DIAGNOSIS — Z6841 Body Mass Index (BMI) 40.0 and over, adult: Secondary | ICD-10-CM | POA: Insufficient documentation

## 2015-02-08 DIAGNOSIS — F319 Bipolar disorder, unspecified: Secondary | ICD-10-CM | POA: Insufficient documentation

## 2015-02-08 DIAGNOSIS — M549 Dorsalgia, unspecified: Secondary | ICD-10-CM

## 2015-02-08 DIAGNOSIS — M79604 Pain in right leg: Secondary | ICD-10-CM | POA: Diagnosis not present

## 2015-02-08 DIAGNOSIS — M5116 Intervertebral disc disorders with radiculopathy, lumbar region: Secondary | ICD-10-CM | POA: Diagnosis not present

## 2015-02-08 DIAGNOSIS — E119 Type 2 diabetes mellitus without complications: Secondary | ICD-10-CM | POA: Diagnosis not present

## 2015-02-08 DIAGNOSIS — F429 Obsessive-compulsive disorder, unspecified: Secondary | ICD-10-CM | POA: Diagnosis not present

## 2015-02-08 DIAGNOSIS — M545 Low back pain, unspecified: Secondary | ICD-10-CM

## 2015-02-08 DIAGNOSIS — I10 Essential (primary) hypertension: Secondary | ICD-10-CM | POA: Diagnosis not present

## 2015-02-08 HISTORY — DX: Type 2 diabetes mellitus without complications: E11.9

## 2015-02-08 HISTORY — PX: LUMBAR LAMINECTOMY/DECOMPRESSION MICRODISCECTOMY: SHX5026

## 2015-02-08 LAB — GLUCOSE, CAPILLARY
GLUCOSE-CAPILLARY: 251 mg/dL — AB (ref 65–99)
Glucose-Capillary: 277 mg/dL — ABNORMAL HIGH (ref 65–99)
Glucose-Capillary: 193 mg/dL — ABNORMAL HIGH (ref 65–99)

## 2015-02-08 SURGERY — LUMBAR LAMINECTOMY/DECOMPRESSION MICRODISCECTOMY 1 LEVEL
Anesthesia: General | Site: Back | Laterality: Right

## 2015-02-08 MED ORDER — LIDOCAINE HCL (CARDIAC) 20 MG/ML IV SOLN
INTRAVENOUS | Status: AC
  Filled 2015-02-08: qty 5

## 2015-02-08 MED ORDER — MIDAZOLAM HCL 2 MG/2ML IJ SOLN
INTRAMUSCULAR | Status: AC
  Filled 2015-02-08: qty 2

## 2015-02-08 MED ORDER — HEMOSTATIC AGENTS (NO CHARGE) OPTIME
TOPICAL | Status: DC | PRN
  Administered 2015-02-08: 1 via TOPICAL

## 2015-02-08 MED ORDER — SODIUM CHLORIDE 0.9 % IR SOLN
Status: DC | PRN
  Administered 2015-02-08: 500 mL

## 2015-02-08 MED ORDER — FENTANYL CITRATE (PF) 250 MCG/5ML IJ SOLN
INTRAMUSCULAR | Status: AC
  Filled 2015-02-08: qty 5

## 2015-02-08 MED ORDER — MIDAZOLAM HCL 2 MG/2ML IJ SOLN
INTRAMUSCULAR | Status: AC
Start: 2015-02-08 — End: 2015-02-08
  Filled 2015-02-08: qty 2

## 2015-02-08 MED ORDER — THROMBIN 5000 UNITS EX SOLR
CUTANEOUS | Status: DC | PRN
  Administered 2015-02-08 (×2): 5000 [IU] via TOPICAL

## 2015-02-08 MED ORDER — SODIUM CHLORIDE 0.9 % IJ SOLN: 3.0000 mL | INTRAMUSCULAR | Status: DC | PRN

## 2015-02-08 MED ORDER — CEFAZOLIN SODIUM 1-5 GM-% IV SOLN
1.0000 g | Freq: Three times a day (TID) | INTRAVENOUS | Status: AC
  Administered 2015-02-08: 1 g via INTRAVENOUS
  Filled 2015-02-08: qty 50

## 2015-02-08 MED ORDER — 0.9 % SODIUM CHLORIDE (POUR BTL) OPTIME
TOPICAL | Status: DC | PRN
  Administered 2015-02-08: 1000 mL

## 2015-02-08 MED ORDER — SUCCINYLCHOLINE CHLORIDE 20 MG/ML IJ SOLN
INTRAMUSCULAR | Status: DC | PRN
  Administered 2015-02-08: 100 mg via INTRAVENOUS

## 2015-02-08 MED ORDER — KETOROLAC TROMETHAMINE 30 MG/ML IJ SOLN
INTRAMUSCULAR | Status: AC
  Filled 2015-02-08: qty 1

## 2015-02-08 MED ORDER — ACETAMINOPHEN 650 MG RE SUPP
650.0000 mg | RECTAL | Status: DC | PRN
Start: 2015-02-08 — End: 2015-02-09

## 2015-02-08 MED ORDER — VANCOMYCIN HCL 1000 MG IV SOLR
INTRAVENOUS | Status: AC
  Filled 2015-02-08: qty 1000

## 2015-02-08 MED ORDER — SODIUM CHLORIDE 0.9 % IJ SOLN
INTRAMUSCULAR | Status: AC
  Filled 2015-02-08: qty 10

## 2015-02-08 MED ORDER — MENTHOL 3 MG MT LOZG: 1.0000 | LOZENGE | OROMUCOSAL | Status: DC | PRN

## 2015-02-08 MED ORDER — FENTANYL CITRATE (PF) 100 MCG/2ML IJ SOLN
INTRAMUSCULAR | Status: DC | PRN
  Administered 2015-02-08 (×3): 50 ug via INTRAVENOUS
  Administered 2015-02-08: 100 ug via INTRAVENOUS

## 2015-02-08 MED ORDER — ROCURONIUM BROMIDE 50 MG/5ML IV SOLN
INTRAVENOUS | Status: AC
  Filled 2015-02-08: qty 1

## 2015-02-08 MED ORDER — HYDROMORPHONE HCL 1 MG/ML IJ SOLN: 0.5000 mg | INTRAMUSCULAR | Status: DC | PRN

## 2015-02-08 MED ORDER — ROCURONIUM BROMIDE 50 MG/5ML IV SOLN
INTRAVENOUS | Status: AC
  Filled 2015-02-08: qty 3

## 2015-02-08 MED ORDER — SODIUM CHLORIDE 0.9 % IV SOLN: 250.0000 mL | INTRAVENOUS | Status: DC

## 2015-02-08 MED ORDER — CYCLOBENZAPRINE HCL 10 MG PO TABS
10.0000 mg | ORAL_TABLET | Freq: Three times a day (TID) | ORAL | Status: DC | PRN
  Administered 2015-02-08: 10 mg via ORAL
  Filled 2015-02-08: qty 1

## 2015-02-08 MED ORDER — EPHEDRINE SULFATE 50 MG/ML IJ SOLN
INTRAMUSCULAR | Status: AC
  Filled 2015-02-08: qty 1

## 2015-02-08 MED ORDER — ONDANSETRON HCL 4 MG/2ML IJ SOLN: 4.0000 mg | INTRAMUSCULAR | Status: DC | PRN

## 2015-02-08 MED ORDER — ONDANSETRON HCL 4 MG/2ML IJ SOLN
INTRAMUSCULAR | Status: AC
  Filled 2015-02-08: qty 2

## 2015-02-08 MED ORDER — GLYCOPYRROLATE 0.2 MG/ML IJ SOLN
INTRAMUSCULAR | Status: DC | PRN
  Administered 2015-02-08: 0.6 mg via INTRAVENOUS

## 2015-02-08 MED ORDER — ONDANSETRON HCL 4 MG/2ML IJ SOLN
INTRAMUSCULAR | Status: DC | PRN
  Administered 2015-02-08: 4 mg via INTRAVENOUS

## 2015-02-08 MED ORDER — INSULIN ASPART 100 UNIT/ML ~~LOC~~ SOLN
SUBCUTANEOUS | Status: AC
  Filled 2015-02-08: qty 1

## 2015-02-08 MED ORDER — HYDROMORPHONE HCL 1 MG/ML IJ SOLN
0.2500 mg | INTRAMUSCULAR | Status: DC | PRN
  Administered 2015-02-08 (×5): 0.5 mg via INTRAVENOUS

## 2015-02-08 MED ORDER — LISINOPRIL 5 MG PO TABS
5.0000 mg | ORAL_TABLET | Freq: Every day | ORAL | Status: DC
  Administered 2015-02-08: 5 mg via ORAL
  Filled 2015-02-08 (×2): qty 1

## 2015-02-08 MED ORDER — HYDROMORPHONE HCL 1 MG/ML IJ SOLN
INTRAMUSCULAR | Status: AC
  Filled 2015-02-08: qty 2

## 2015-02-08 MED ORDER — OXYCODONE-ACETAMINOPHEN 5-325 MG PO TABS
1.0000 | ORAL_TABLET | ORAL | Status: DC | PRN
  Administered 2015-02-08 – 2015-02-09 (×4): 2 via ORAL
  Filled 2015-02-08 (×4): qty 2

## 2015-02-08 MED ORDER — PHENOL 1.4 % MT LIQD: 1.0000 | OROMUCOSAL | Status: DC | PRN

## 2015-02-08 MED ORDER — ROCURONIUM BROMIDE 100 MG/10ML IV SOLN
INTRAVENOUS | Status: DC | PRN
  Administered 2015-02-08: 20 mg via INTRAVENOUS
  Administered 2015-02-08: 10 mg via INTRAVENOUS
  Administered 2015-02-08: 50 mg via INTRAVENOUS

## 2015-02-08 MED ORDER — VANCOMYCIN HCL 1000 MG IV SOLR
INTRAVENOUS | Status: DC | PRN
  Administered 2015-02-08: 1000 mg

## 2015-02-08 MED ORDER — NEOSTIGMINE METHYLSULFATE 10 MG/10ML IV SOLN
INTRAVENOUS | Status: DC | PRN
  Administered 2015-02-08: 4 mg via INTRAVENOUS

## 2015-02-08 MED ORDER — INSULIN ASPART 100 UNIT/ML ~~LOC~~ SOLN
0.0000 [IU] | Freq: Three times a day (TID) | SUBCUTANEOUS | Status: DC
  Administered 2015-02-08: 11 [IU] via SUBCUTANEOUS
  Administered 2015-02-09: 4 [IU] via SUBCUTANEOUS

## 2015-02-08 MED ORDER — LACTATED RINGERS IV SOLN
INTRAVENOUS | Status: DC
  Administered 2015-02-08 (×2): via INTRAVENOUS

## 2015-02-08 MED ORDER — SODIUM CHLORIDE 0.9 % IJ SOLN
3.0000 mL | Freq: Two times a day (BID) | INTRAMUSCULAR | Status: DC
Start: 2015-02-08 — End: 2015-02-09
  Administered 2015-02-08: 3 mL via INTRAVENOUS

## 2015-02-08 MED ORDER — DEXAMETHASONE SODIUM PHOSPHATE 10 MG/ML IJ SOLN
INTRAMUSCULAR | Status: AC
  Filled 2015-02-08: qty 1

## 2015-02-08 MED ORDER — HYDROCODONE-ACETAMINOPHEN 5-325 MG PO TABS: 1.0000 | ORAL_TABLET | ORAL | Status: DC | PRN

## 2015-02-08 MED ORDER — KETOROLAC TROMETHAMINE 30 MG/ML IJ SOLN
30.0000 mg | Freq: Four times a day (QID) | INTRAMUSCULAR | Status: DC
  Administered 2015-02-08 (×2): 30 mg via INTRAVENOUS
  Filled 2015-02-08 (×2): qty 1

## 2015-02-08 MED ORDER — ACETAMINOPHEN 325 MG PO TABS: 650.0000 mg | ORAL_TABLET | ORAL | Status: DC | PRN

## 2015-02-08 MED ORDER — HYDROMORPHONE HCL 1 MG/ML IJ SOLN
INTRAMUSCULAR | Status: AC
  Filled 2015-02-08: qty 1

## 2015-02-08 MED ORDER — INSULIN ASPART 100 UNIT/ML ~~LOC~~ SOLN
4.0000 [IU] | Freq: Once | SUBCUTANEOUS | Status: AC
  Administered 2015-02-08: 4 [IU] via SUBCUTANEOUS

## 2015-02-08 MED ORDER — PROPOFOL 10 MG/ML IV BOLUS
INTRAVENOUS | Status: DC | PRN
  Administered 2015-02-08: 200 mg via INTRAVENOUS
  Administered 2015-02-08: 90 mg via INTRAVENOUS

## 2015-02-08 MED ORDER — KETOROLAC TROMETHAMINE 30 MG/ML IJ SOLN
INTRAMUSCULAR | Status: DC | PRN
  Administered 2015-02-08: 30 mg via INTRAVENOUS

## 2015-02-08 MED ORDER — PROPOFOL 10 MG/ML IV BOLUS
INTRAVENOUS | Status: AC
  Filled 2015-02-08: qty 20

## 2015-02-08 MED ORDER — LIDOCAINE HCL (CARDIAC) 20 MG/ML IV SOLN
INTRAVENOUS | Status: DC | PRN
  Administered 2015-02-08: 60 mg via INTRAVENOUS

## 2015-02-08 MED ORDER — MIDAZOLAM HCL 5 MG/5ML IJ SOLN
INTRAMUSCULAR | Status: DC | PRN
  Administered 2015-02-08 (×2): 1 mg via INTRAVENOUS

## 2015-02-08 MED ORDER — PHENYLEPHRINE 40 MCG/ML (10ML) SYRINGE FOR IV PUSH (FOR BLOOD PRESSURE SUPPORT)
PREFILLED_SYRINGE | INTRAVENOUS | Status: AC
  Filled 2015-02-08: qty 10

## 2015-02-08 MED ORDER — GLYCOPYRROLATE 0.2 MG/ML IJ SOLN
INTRAMUSCULAR | Status: AC
  Filled 2015-02-08: qty 3

## 2015-02-08 SURGICAL SUPPLY — 46 items
BAG DECANTER FOR FLEXI CONT (MISCELLANEOUS) ×3 IMPLANT
BENZOIN TINCTURE PRP APPL 2/3 (GAUZE/BANDAGES/DRESSINGS) ×3 IMPLANT
BLADE CLIPPER SURG (BLADE) IMPLANT
BRUSH SCRUB EZ PLAIN DRY (MISCELLANEOUS) ×3 IMPLANT
BUR CUTTER 7.0 ROUND (BURR) ×3 IMPLANT
CANISTER SUCT 3000ML PPV (MISCELLANEOUS) ×3 IMPLANT
CLOSURE WOUND 1/2 X4 (GAUZE/BANDAGES/DRESSINGS) ×1
DECANTER SPIKE VIAL GLASS SM (MISCELLANEOUS) ×3 IMPLANT
DRAPE LAPAROTOMY 100X72X124 (DRAPES) ×3 IMPLANT
DRAPE MICROSCOPE LEICA (MISCELLANEOUS) ×3 IMPLANT
DRAPE POUCH INSTRU U-SHP 10X18 (DRAPES) ×3 IMPLANT
DRAPE PROXIMA HALF (DRAPES) IMPLANT
DRAPE SURG 17X23 STRL (DRAPES) ×6 IMPLANT
DRSG OPSITE POSTOP 4X6 (GAUZE/BANDAGES/DRESSINGS) ×3 IMPLANT
DURAPREP 26ML APPLICATOR (WOUND CARE) ×3 IMPLANT
ELECT REM PT RETURN 9FT ADLT (ELECTROSURGICAL) ×3
ELECTRODE REM PT RTRN 9FT ADLT (ELECTROSURGICAL) ×1 IMPLANT
GAUZE SPONGE 4X4 12PLY STRL (GAUZE/BANDAGES/DRESSINGS) ×3 IMPLANT
GAUZE SPONGE 4X4 16PLY XRAY LF (GAUZE/BANDAGES/DRESSINGS) IMPLANT
GLOVE ECLIPSE 9.0 STRL (GLOVE) ×3 IMPLANT
GLOVE EXAM NITRILE LRG STRL (GLOVE) IMPLANT
GLOVE EXAM NITRILE MD LF STRL (GLOVE) IMPLANT
GLOVE EXAM NITRILE XL STR (GLOVE) IMPLANT
GLOVE EXAM NITRILE XS STR PU (GLOVE) IMPLANT
GOWN STRL REUS W/ TWL LRG LVL3 (GOWN DISPOSABLE) IMPLANT
GOWN STRL REUS W/ TWL XL LVL3 (GOWN DISPOSABLE) ×1 IMPLANT
GOWN STRL REUS W/TWL 2XL LVL3 (GOWN DISPOSABLE) IMPLANT
GOWN STRL REUS W/TWL LRG LVL3 (GOWN DISPOSABLE)
GOWN STRL REUS W/TWL XL LVL3 (GOWN DISPOSABLE) ×2
KIT BASIN OR (CUSTOM PROCEDURE TRAY) ×3 IMPLANT
KIT ROOM TURNOVER OR (KITS) ×3 IMPLANT
LIQUID BAND (GAUZE/BANDAGES/DRESSINGS) ×3 IMPLANT
NEEDLE HYPO 22GX1.5 SAFETY (NEEDLE) ×3 IMPLANT
NEEDLE SPNL 22GX3.5 QUINCKE BK (NEEDLE) ×3 IMPLANT
NS IRRIG 1000ML POUR BTL (IV SOLUTION) ×3 IMPLANT
PACK LAMINECTOMY NEURO (CUSTOM PROCEDURE TRAY) ×3 IMPLANT
PAD ARMBOARD 7.5X6 YLW CONV (MISCELLANEOUS) ×9 IMPLANT
RUBBERBAND STERILE (MISCELLANEOUS) ×6 IMPLANT
SPONGE SURGIFOAM ABS GEL SZ50 (HEMOSTASIS) ×3 IMPLANT
STRIP CLOSURE SKIN 1/2X4 (GAUZE/BANDAGES/DRESSINGS) ×2 IMPLANT
SUT VIC AB 2-0 CT1 18 (SUTURE) ×3 IMPLANT
SUT VIC AB 3-0 SH 8-18 (SUTURE) ×3 IMPLANT
TAPE STRIPS DRAPE STRL (GAUZE/BANDAGES/DRESSINGS) ×3 IMPLANT
TOWEL OR 17X24 6PK STRL BLUE (TOWEL DISPOSABLE) ×3 IMPLANT
TOWEL OR 17X26 10 PK STRL BLUE (TOWEL DISPOSABLE) ×3 IMPLANT
WATER STERILE IRR 1000ML POUR (IV SOLUTION) ×3 IMPLANT

## 2015-02-08 NOTE — Anesthesia Procedure Notes (Signed)
Procedure Name: Intubation Date/Time: 02/08/2015 11:37 AM Performed by: Fabian NovemberSOLHEIM, Carolyn Ingram Pre-anesthesia Checklist: Patient identified, Patient being monitored, Timeout performed, Emergency Drugs available and Suction available Patient Re-evaluated:Patient Re-evaluated prior to inductionOxygen Delivery Method: Circle System Utilized Preoxygenation: Pre-oxygenation with 100% oxygen Intubation Type: IV induction Ventilation: Mask ventilation without difficulty Laryngoscope Size: Miller and 3 Grade View: Grade I Tube type: Oral Tube size: 7.0 mm Number of attempts: 1 Airway Equipment and Method: Stylet and Patient positioned with wedge pillow Placement Confirmation: ETT inserted through vocal cords under direct vision,  positive ETCO2 and breath sounds checked- equal and bilateral Secured at: 21 cm Tube secured with: Tape Dental Injury: Teeth and Oropharynx as per pre-operative assessment

## 2015-02-08 NOTE — Progress Notes (Addendum)
No Medical Clearance note has been faxed. Call [facility] @ Dr. Lindalou HosePool's office about clearance note,they have not received it from Dr. Mayford KnifeWilliams. Carolyn NoeVanessa will call and try to get them to send ASAP.  Spoke with patient, she saw Dr. Mayford KnifeWilliams and was started on metformin but the patient has not started taking it yet. CBG this AM 224. She is not checking blood glucose at home.  Information called to Dr. Sampson GoonFitzgerald.

## 2015-02-08 NOTE — H&P (Signed)
  Carolyn Ingram is an 19 y.o. female.   Chief Complaint: Back pain HPI: 19 year old female with severe low back pain with radiation to her right lower extremity consistent with a right-sided S1 radiculopathy and failing conservative management. Workup demonstrates evidence of a Marlow large moderately large right paracentral disc herniation L5-S1 with compression of the thecal sac and right S1 nerve root. Patient presents now for microdiscectomy in hopes of improving her symptoms.  Past Medical History  Diagnosis Date  . Anemia   . Menorrhagia   . Shortness of breath dyspnea   . Morbid obesity with BMI of 50.0-59.9, adult (HCC)   . Hypertension   . Bipolar disorder (HCC)   . Headache   . Wears glasses   . Numbness and tingling of leg     right  . HNP (herniated nucleus pulposus)   . OCD (obsessive compulsive disorder)   . Tourette's disorder   . Diabetes mellitus without complication (HCC)     History reviewed. No pertinent past surgical history.  Family History  Problem Relation Age of Onset  . Cancer Mother   . Diabetes Mother   . Hypertension Mother   . Cancer Father   . Diabetes Father   . Hypertension Father    Social History:  reports that she has never smoked. She has never used smokeless tobacco. She reports that she does not drink alcohol or use illicit drugs.  Allergies: No Known Allergies  Medications Prior to Admission  Medication Sig Dispense Refill  . lisinopril (PRINIVIL,ZESTRIL) 5 MG tablet Take 5 mg by mouth daily.    . diazepam (VALIUM) 5 MG tablet Take 1 tablet (5 mg total) by mouth every 6 (six) hours as needed for muscle spasms. (Patient not taking: Reported on 02/01/2015) 13 tablet 0  . oxyCODONE-acetaminophen (PERCOCET/ROXICET) 5-325 MG tablet Take 1-2 tablets by mouth every 6 (six) hours as needed for moderate pain. (Patient not taking: Reported on 02/01/2015) 10 tablet 0  . predniSONE (DELTASONE) 50 MG tablet Take 1 tablet daily with breakfast  (Patient not taking: Reported on 02/01/2015) 5 tablet 0    No results found for this or any previous visit (from the past 48 hour(s)). No results found.  Pertinent items are noted in HPI. Pertinent items noted in HPI and remainder of comprehensive ROS otherwise negative.  Blood pressure 119/73, pulse 93, temperature 97.4 F (36.3 C), temperature source Oral, resp. rate 20, weight 146.512 kg (323 lb), SpO2 98 %.  Patient is awake and alert. She is morbidly obese. She is pleasant and cooperative. Examination head ears eyes others unremarkable. Chest and abdomen are obese but otherwise benign. Examination extremities are free from injury or deformity. Neurologically she is awake and alert. She is oriented and appropriate. Her speech is fluent. Straight-leg raising is positive on the right negative on the left. Motor examination reveals decreased sensation to pinprick and light touch in right S1 dermatome. Right gastrocnemius muscle weakness. Reflexes are absent in both ankles. Assessment/Plan Right L5-S1 herniated pulposus with the radiculopathy. Plan right L5-S1 laminotomy and microdiscectomy. Risks and benefits explained, patient wishes to proceed.  Zacharius Funari A 02/08/2015, 11:06 AM

## 2015-02-08 NOTE — Transfer of Care (Signed)
Immediate Anesthesia Transfer of Care Note  Patient: Carolyn Ingram  Procedure(s) Performed: Procedure(s) with comments: Microdiscectomy - right - L5-S1  (Right) - Microdiscectomy - right - L5-S1   Patient Location: PACU  Anesthesia Type:General  Level of Consciousness: awake, alert , oriented and patient cooperative  Airway & Oxygen Therapy: Patient Spontanous Breathing and Patient connected to face mask oxygen  Post-op Assessment: Report given to RN, Post -op Vital signs reviewed and stable and Patient moving all extremities X 4  Post vital signs: Reviewed and stable  Last Vitals:  Filed Vitals:   02/08/15 0957 02/08/15 1334  BP: 119/73   Pulse: 93   Temp: 36.3 C 36.1 C  Resp: 20     Complications: No apparent anesthesia complications

## 2015-02-08 NOTE — Anesthesia Postprocedure Evaluation (Signed)
Anesthesia Post Note  Patient: Carolyn Ingram  Procedure(s) Performed: Procedure(s) (LRB): Microdiscectomy - right - L5-S1  (Right)  Patient location during evaluation: PACU Anesthesia Type: General Level of consciousness: awake and alert Pain management: pain level controlled Vital Signs Assessment: post-procedure vital signs reviewed and stable Respiratory status: spontaneous breathing, nonlabored ventilation, respiratory function stable and patient connected to face mask oxygen Cardiovascular status: blood pressure returned to baseline and stable Postop Assessment: no signs of nausea or vomiting Anesthetic complications: no    Last Vitals:  Filed Vitals:   02/08/15 1430 02/08/15 1445  BP: 107/80 148/87  Pulse: 105 89  Temp:    Resp: 25 31    Last Pain:  Filed Vitals:   02/08/15 1500  PainSc: Asleep                 Hadeel Hillebrand,W. EDMOND

## 2015-02-08 NOTE — Brief Op Note (Signed)
02/08/2015  1:25 PM  PATIENT:  Carolyn Ingram  19 y.o. female  PRE-OPERATIVE DIAGNOSIS:  HNP  POST-OPERATIVE DIAGNOSIS:  herniated nucleus pulposus  PROCEDURE:  Procedure(s) with comments: Microdiscectomy - right - L5-S1  (Right) - Microdiscectomy - right - L5-S1   SURGEON:  Surgeon(s) and Role:    * Julio SicksHenry Fanchon Papania, MD - Primary    * Loura HaltBenjamin Jared Ditty, MD - Assisting  PHYSICIAN ASSISTANT:   ASSISTANTS:    ANESTHESIA:   general  EBL:  Total I/O In: 1000 [I.V.:1000] Out: 200 [Blood:200]  BLOOD ADMINISTERED:none  DRAINS: none   LOCAL MEDICATIONS USED:  MARCAINE     SPECIMEN:  No Specimen  DISPOSITION OF SPECIMEN:  N/A  COUNTS:  YES  TOURNIQUET:  * No tourniquets in log *  DICTATION: .Dragon Dictation  PLAN OF CARE: Admit for overnight observation  PATIENT DISPOSITION:  PACU - hemodynamically stable.   Delay start of Pharmacological VTE agent (>24hrs) due to surgical blood loss or risk of bleeding: yes

## 2015-02-08 NOTE — Op Note (Signed)
Date of procedure: 02/08/2015  Date of dictation: Same  Service: Neurosurgery  Preoperative diagnosis: Right L5-S1 herniated nucleus pulposus with radiculopathy  Postoperative diagnosis: Same  Procedure Name: Right L5-S1 laminotomy and microdiscectomy  Surgeon:Henry A.Pool, M.D.  Asst. Surgeon: Ditty  Anesthesia: General  Indication: 19 year old female with intractable back and right lower extremity pain complicated by morbid obesity and recently diagnosed poorly controlled diabetes. Workup demonstrates evidence of a right-sided L5-S1 disc herniation. Patient is failed conservative management and presents now for microdiscectomy.  Operative note: After induction of anesthesia, patient position prone onto Wilson frame appropriate padded. Lumbar region prepped and draped sterilely. Incision made overlying L5-S1. Dissection performed on the right side. Retractor placed. X-ray taken. Level confirmed. Laminotomy performed using high-speed drill and Kerrison rongeurs to remove the inferior aspect lamina of L5 medial aspect the L5-S1 facet joint and the superior rim of the S1 lamina. Ligament flavum elevated and resected piecemeal fashion. Underlying thecal sac and S1 nerve root were identified. Epidural venous plexus quite related and cut. Microscope brought into field these were microdissection. Thecal sac and S1 nerve root gently mobilized and retracted towards midline. Disc herniation was readily apparent. The disc herniation self was partially calcified. The disc was incised with a 15 blade. Discectomies and performed using pituitary rongeurs operative of rongeurs and Epstein curettes. All elements the disc herniation were completely resected. All loose or obviously degenerative disc 0 was removed and interspace. This point a very thorough discectomy been achieved. There was no evidence of injury to thecal sac or nerve roots. Wound is then irrigated with and bike solution. Gelfoam was placed  topically for hemostasis which is found to be good. Microscope and retractor system were removed. There were no apparent complications. Patient tolerated the procedure well and she returns to the recovery room postop.

## 2015-02-08 NOTE — Anesthesia Preprocedure Evaluation (Addendum)
Anesthesia Evaluation  Patient identified by MRN, date of birth, ID band Patient awake    Reviewed: Allergy & Precautions, H&P , NPO status , Patient's Chart, lab work & pertinent test results  Airway Mallampati: I  TM Distance: >3 FB Neck ROM: Full    Dental no notable dental hx. (+) Teeth Intact, Dental Advisory Given   Pulmonary neg pulmonary ROS,    Pulmonary exam normal breath sounds clear to auscultation       Cardiovascular hypertension, Pt. on medications  Rhythm:Regular Rate:Normal     Neuro/Psych  Headaches, Bipolar Disorder    GI/Hepatic negative GI ROS, Neg liver ROS,   Endo/Other  diabetes, Type 2, Oral Hypoglycemic AgentsMorbid obesity  Renal/GU negative Renal ROS  negative genitourinary   Musculoskeletal   Abdominal (+) + obese,  Abdomen: soft. Bowel sounds: normal.  Peds  Hematology negative hematology ROS (+)   Anesthesia Other Findings   Reproductive/Obstetrics negative OB ROS                           BP Readings from Last 3 Encounters:  02/08/15 119/73  02/01/15 143/90  11/29/14 131/66   Lab Results  Component Value Date   WBC 10.9* 02/01/2015   HGB 13.9 02/01/2015   HCT 42.6 02/01/2015   MCV 88.0 02/01/2015   PLT 326 02/01/2015     Chemistry      Component Value Date/Time   NA 137 02/01/2015 1308   K 3.9 02/01/2015 1308   CL 100* 02/01/2015 1308   CO2 27 02/01/2015 1308   BUN 5* 02/01/2015 1308   CREATININE 0.65 02/01/2015 1308      Component Value Date/Time   CALCIUM 10.2 02/01/2015 1308       Anesthesia Physical Anesthesia Plan  ASA: III  Anesthesia Plan: General   Post-op Pain Management:    Induction: Intravenous  Airway Management Planned: Oral ETT  Additional Equipment:   Intra-op Plan:   Post-operative Plan: Extubation in OR  Informed Consent: I have reviewed the patients History and Physical, chart, labs and discussed the  procedure including the risks, benefits and alternatives for the proposed anesthesia with the patient or authorized representative who has indicated his/her understanding and acceptance.   Dental advisory given  Plan Discussed with: CRNA  Anesthesia Plan Comments:         Anesthesia Quick Evaluation

## 2015-02-09 ENCOUNTER — Encounter (HOSPITAL_COMMUNITY): Payer: Self-pay | Admitting: Neurosurgery

## 2015-02-09 DIAGNOSIS — M5116 Intervertebral disc disorders with radiculopathy, lumbar region: Secondary | ICD-10-CM | POA: Diagnosis not present

## 2015-02-09 LAB — GLUCOSE, CAPILLARY: GLUCOSE-CAPILLARY: 183 mg/dL — AB (ref 65–99)

## 2015-02-09 MED ORDER — DIAZEPAM 5 MG PO TABS
5.0000 mg | ORAL_TABLET | Freq: Four times a day (QID) | ORAL | Status: DC | PRN
Start: 2015-02-09 — End: 2017-04-27

## 2015-02-09 MED ORDER — OXYCODONE-ACETAMINOPHEN 5-325 MG PO TABS: 1.0000 | ORAL_TABLET | Freq: Four times a day (QID) | ORAL | Status: DC | PRN

## 2015-02-09 MED ORDER — CEPHALEXIN 500 MG PO CAPS: 500.0000 mg | ORAL_CAPSULE | Freq: Four times a day (QID) | ORAL | Status: DC

## 2015-02-09 NOTE — Discharge Instructions (Signed)

## 2015-02-09 NOTE — Progress Notes (Signed)
Inpatient Diabetes Program Recommendations  AACE/ADA: New Consensus Statement on Inpatient Glycemic Control (2015)  Target Ranges:  Prepandial:   less than 140 mg/dL      Peak postprandial:   less than 180 mg/dL (1-2 hours)      Critically ill patients:  140 - 180 mg/dL   Review of Glycemic Control  Diabetes history: Appears to be dm 2 new onset-note from anesthesia addressed HgbA1C of 10.9% on pre-evaluation Pt to follow-up with primary MD Spoke with RN's caring for patient. Correction insulin was helpful, but this patient was not agreeable to the cho mod diet. No further note.   Thank you Lenor CoffinAnn Suheily Birks, RN, MSN, CDE  Diabetes Inpatient Program Office: [phone] Pager: [phone] 8:00 am to 5:00 pm

## 2015-02-09 NOTE — Progress Notes (Signed)
Patient alert and oriented, mae's well, voiding adequate amount of urine, swallowing without difficulty, c/o mild pain. Patient discharged home with family. Script and discharged instructions given to patient. Patient and family stated understanding of d/c instructions given and has an appointment with MD.   

## 2015-02-09 NOTE — Discharge Summary (Signed)
Physician Discharge Summary  Patient ID: Carolyn Ingram MRN: [id number] DOB/AGE: 17-Jul-1995 19 y.o.  Admit date: 02/08/2015 Discharge date: 02/09/2015  Admission Diagnoses:  Discharge Diagnoses:  Active Problems:   Lumbar disc herniation with radiculopathy   Discharged Condition: good  Hospital Course: The patient was admitted to the hospital where she underwent an uncomplicated right-sided L5-S1 laminotomy and microdiscectomy. Postoperatively she is doing fairly well. Preoperative right lower extremity pain much improved. Back pain well controlled. Mobilizing slowly secondary to her size. Ready for discharge home.  Consults:   Significant Diagnostic Studies:   Treatments:   Discharge Exam: Blood pressure 105/55, pulse 99, temperature 98 F (36.7 C), temperature source Oral, resp. rate 18, weight 146.512 kg (323 lb), SpO2 94 %. Awake and alert. Oriented and appropriate. Motor and sensory function stable. Wound clean and dry. Chest and abdomen benign.  Disposition: 01-Home or Self Care     Medication List    STOP taking these medications        predniSONE 50 MG tablet  Commonly known as:  DELTASONE      TAKE these medications        diazepam 5 MG tablet  Commonly known as:  VALIUM  Take 1 tablet (5 mg total) by mouth every 6 (six) hours as needed for muscle spasms.     lisinopril 5 MG tablet  Commonly known as:  PRINIVIL,ZESTRIL  Take 5 mg by mouth daily.     oxyCODONE-acetaminophen 5-325 MG tablet  Commonly known as:  PERCOCET/ROXICET  Take 1-2 tablets by mouth every 6 (six) hours as needed for moderate pain.           Follow-up Information    Follow up with Temple PaciniPOOL,Braven Wolk A, MD.   Specialty:  Neurosurgery   Contact information:   [address] [place] [address] [phone]       Signed: Temple PaciniOOL,Kervens Roper A 02/09/2015, 10:24 AM

## 2015-02-10 LAB — GLUCOSE, CAPILLARY
GLUCOSE-CAPILLARY: 219 mg/dL — AB (ref 65–99)
Glucose-Capillary: 224 mg/dL — ABNORMAL HIGH (ref 65–99)

## 2015-10-19 IMAGING — CR DG LUMBAR SPINE COMPLETE 4+V
5 series · 5 of 5 positions shown · non-contrast
Comparison: None.

CLINICAL DATA: Fall with back and right hip pain. Initial
encounter.

EXAM:
LUMBAR SPINE - COMPLETE 4+ VIEW

[l-spine ap]
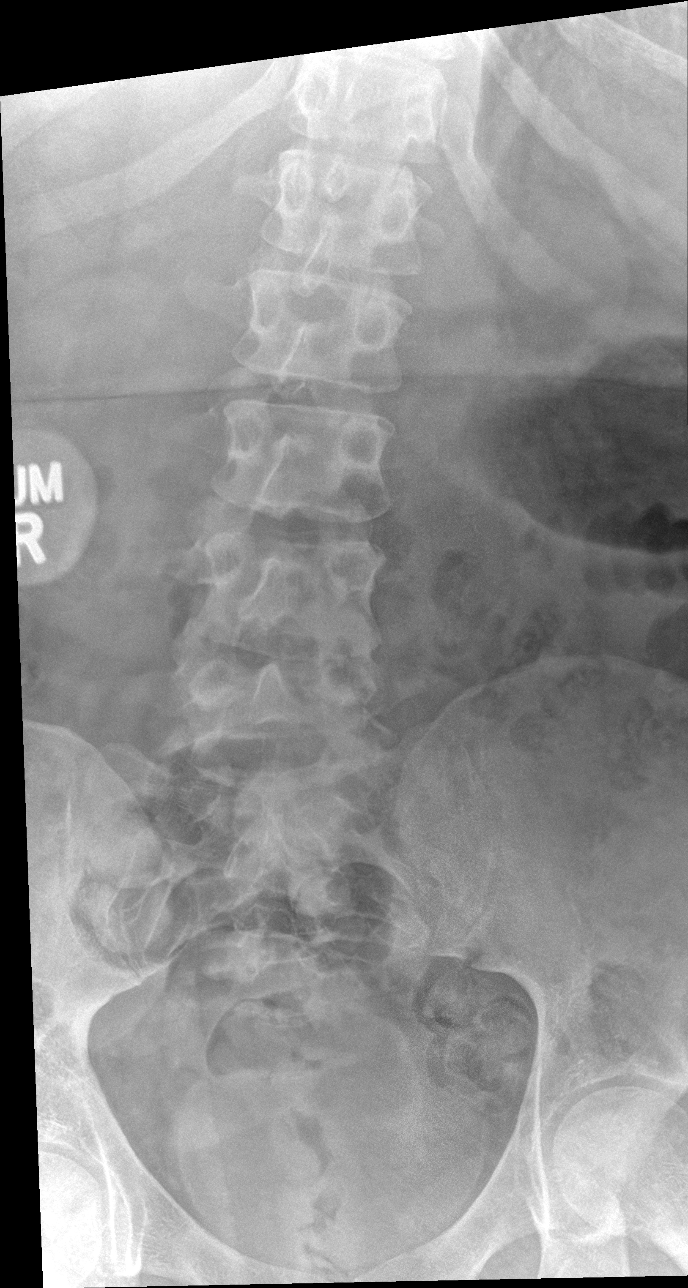

[l-spine obl (1 of 2)]
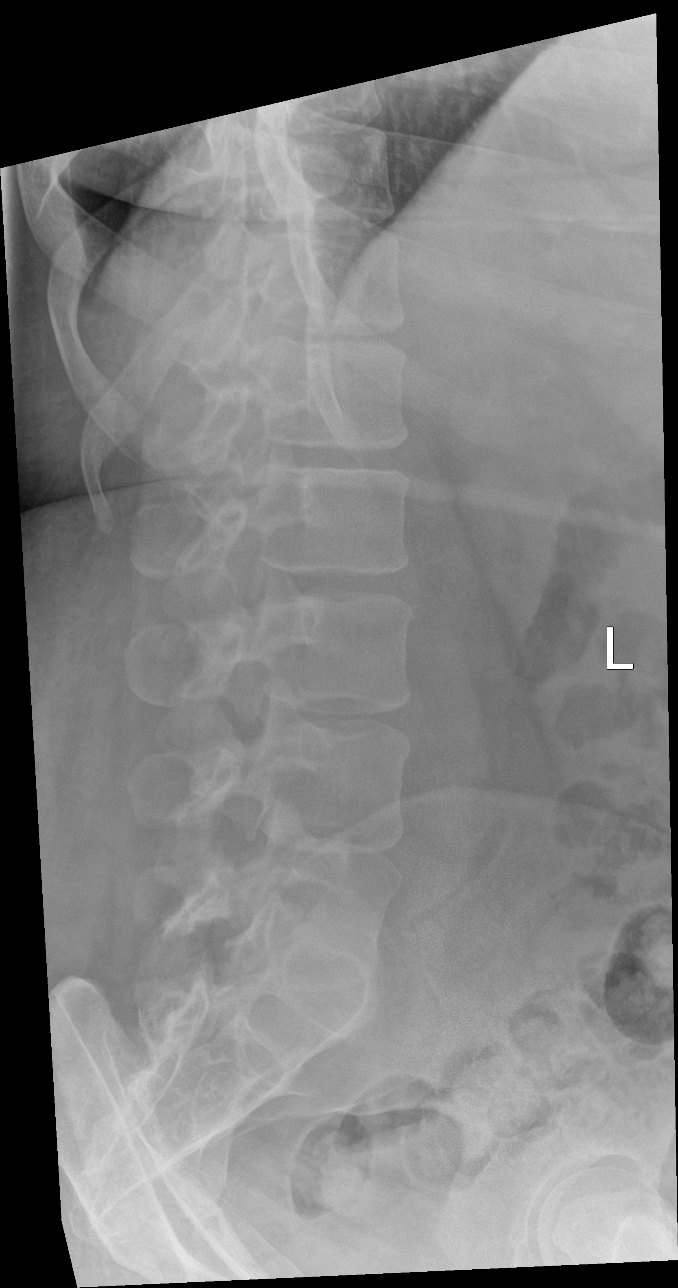

[l-spine obl (2 of 2)]
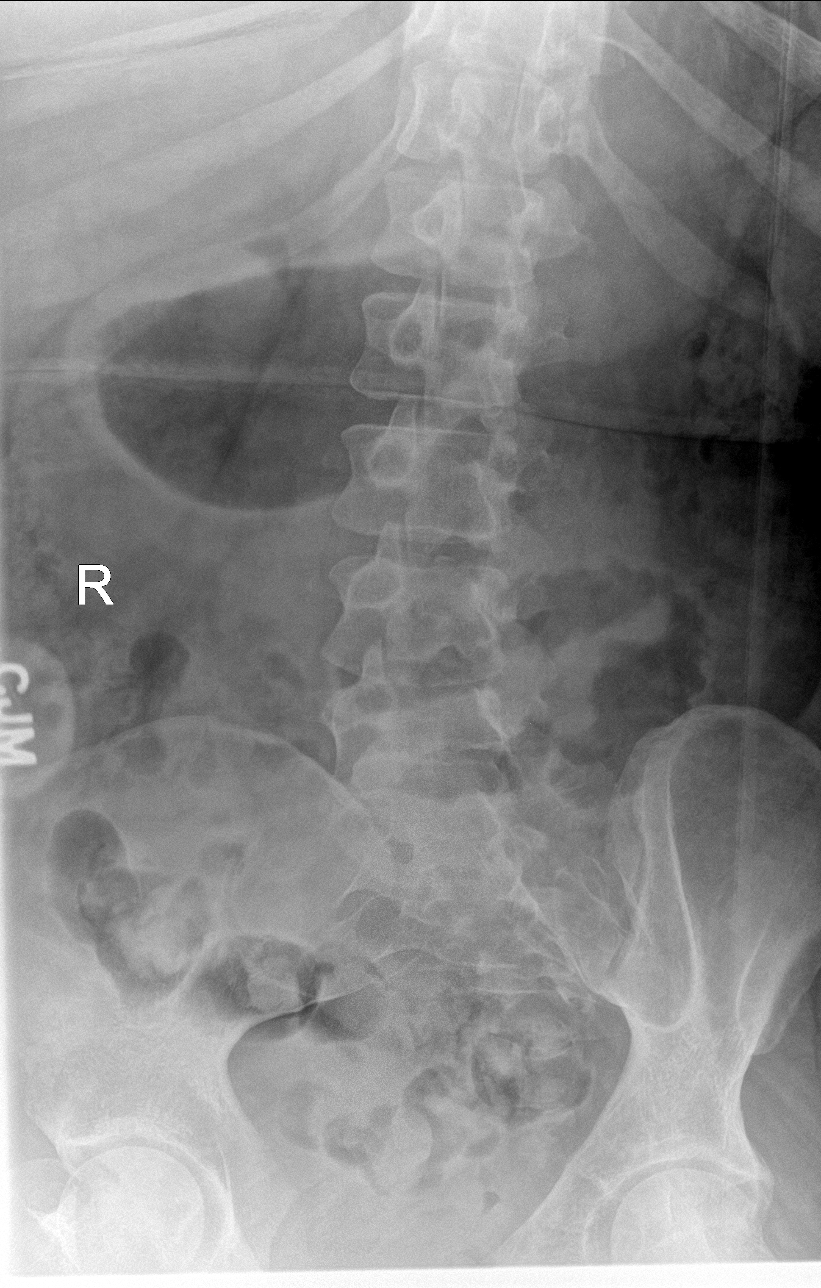

[l-spine lat]
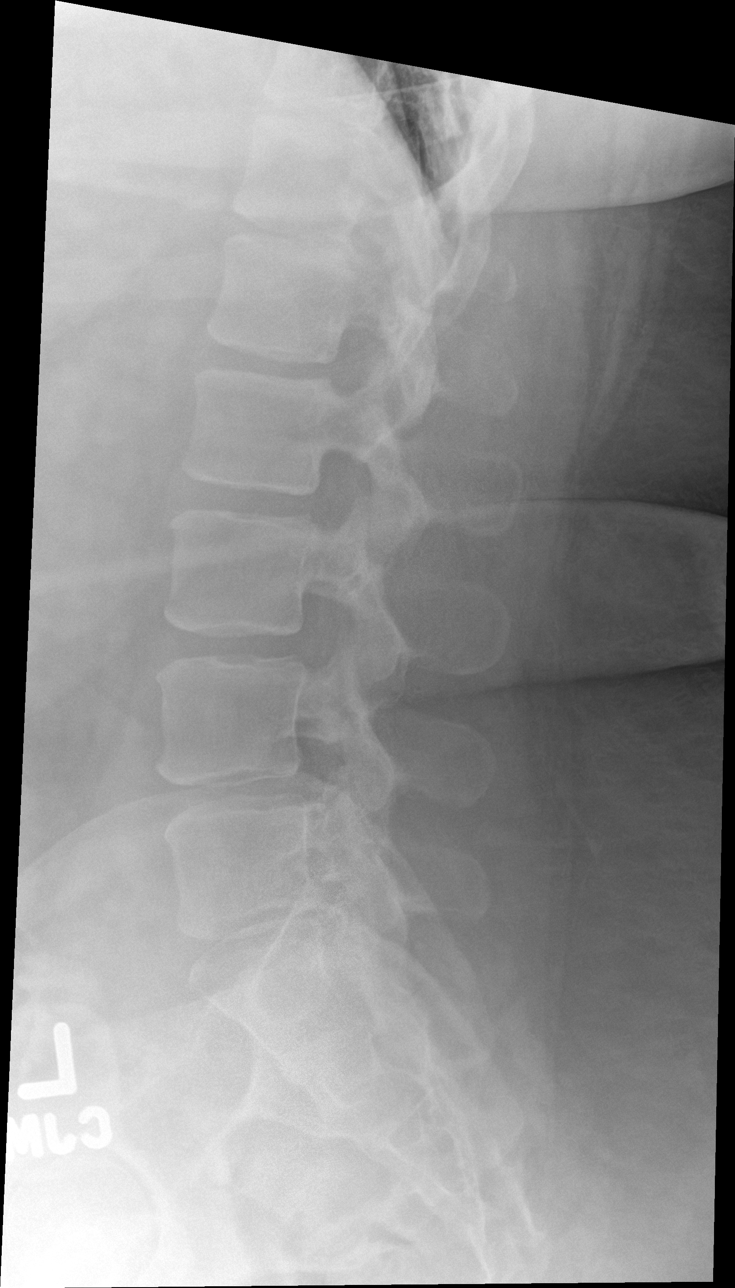

[l-spine spot]
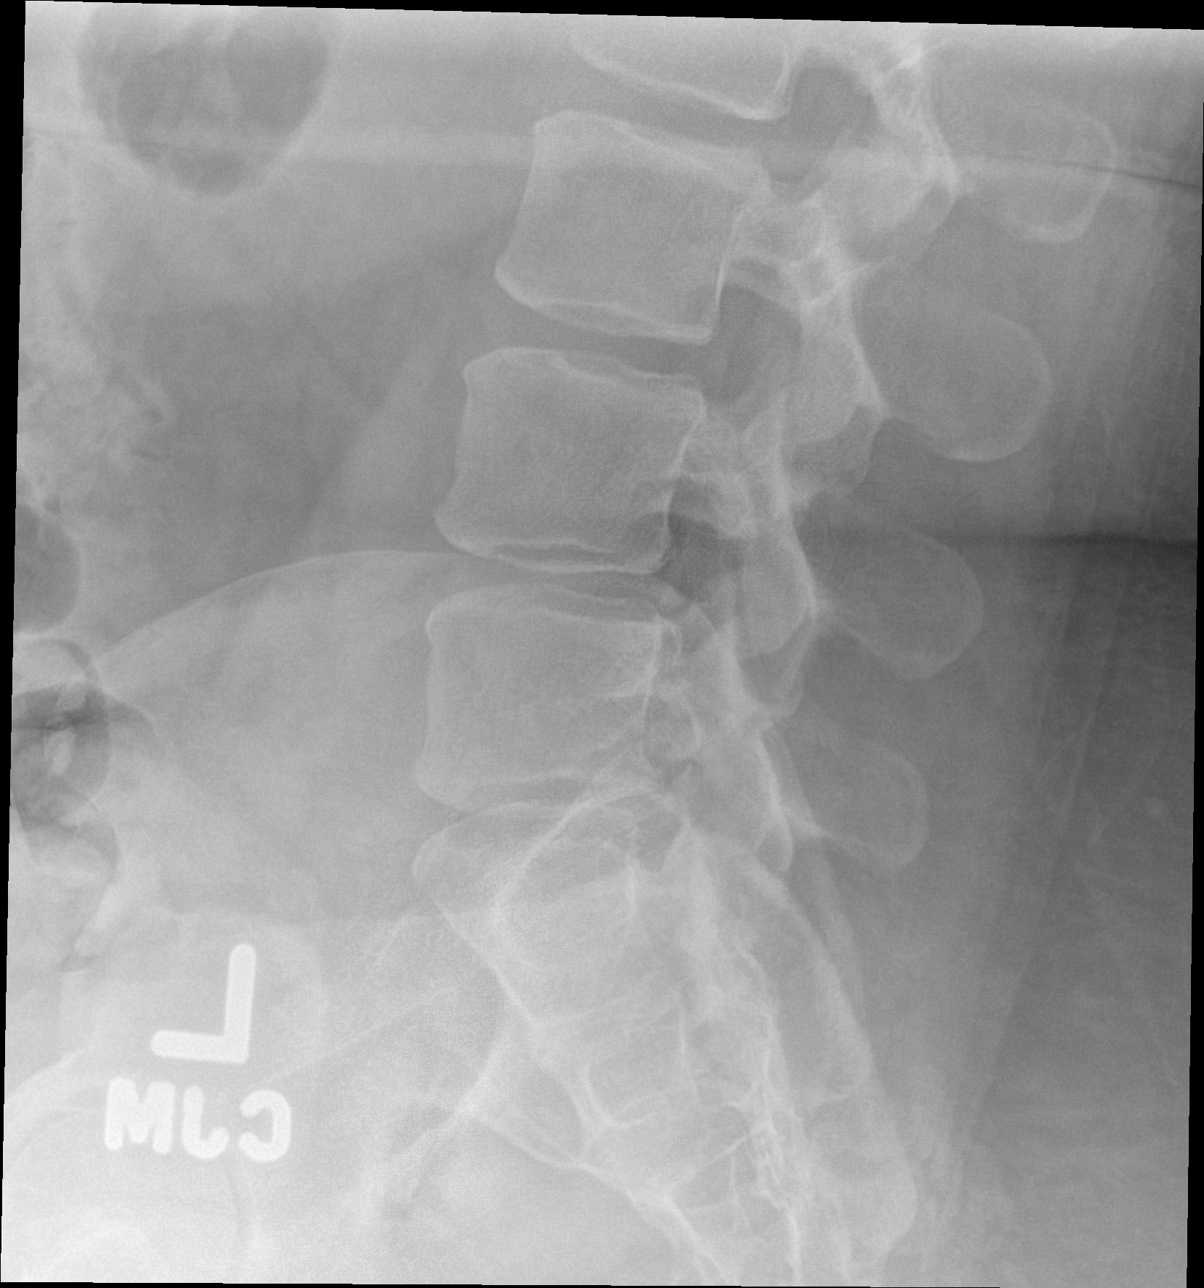

[5 of 5 positions shown; findings below may reference images not displayed]

FINDINGS: No acute fracture. Trace L4-5 anterolisthesis without visible
spondylolysis or facet degeneration. No disc narrowing or endplate
irregularity.
IMPRESSION: Negative for fracture.

## 2015-12-29 IMAGING — CR DG LUMBAR SPINE 2-3V
2 series · 2 of 2 positions shown · non-contrast
Comparison: Prior MRI 12/23/2014

CLINICAL DATA: 19-year-old female undergoing L5-S1 micro
diskectomy. Intraoperative localization radiographs.

EXAM:
LUMBAR SPINE - 2-3 VIEW

[lat (1 of 2)]
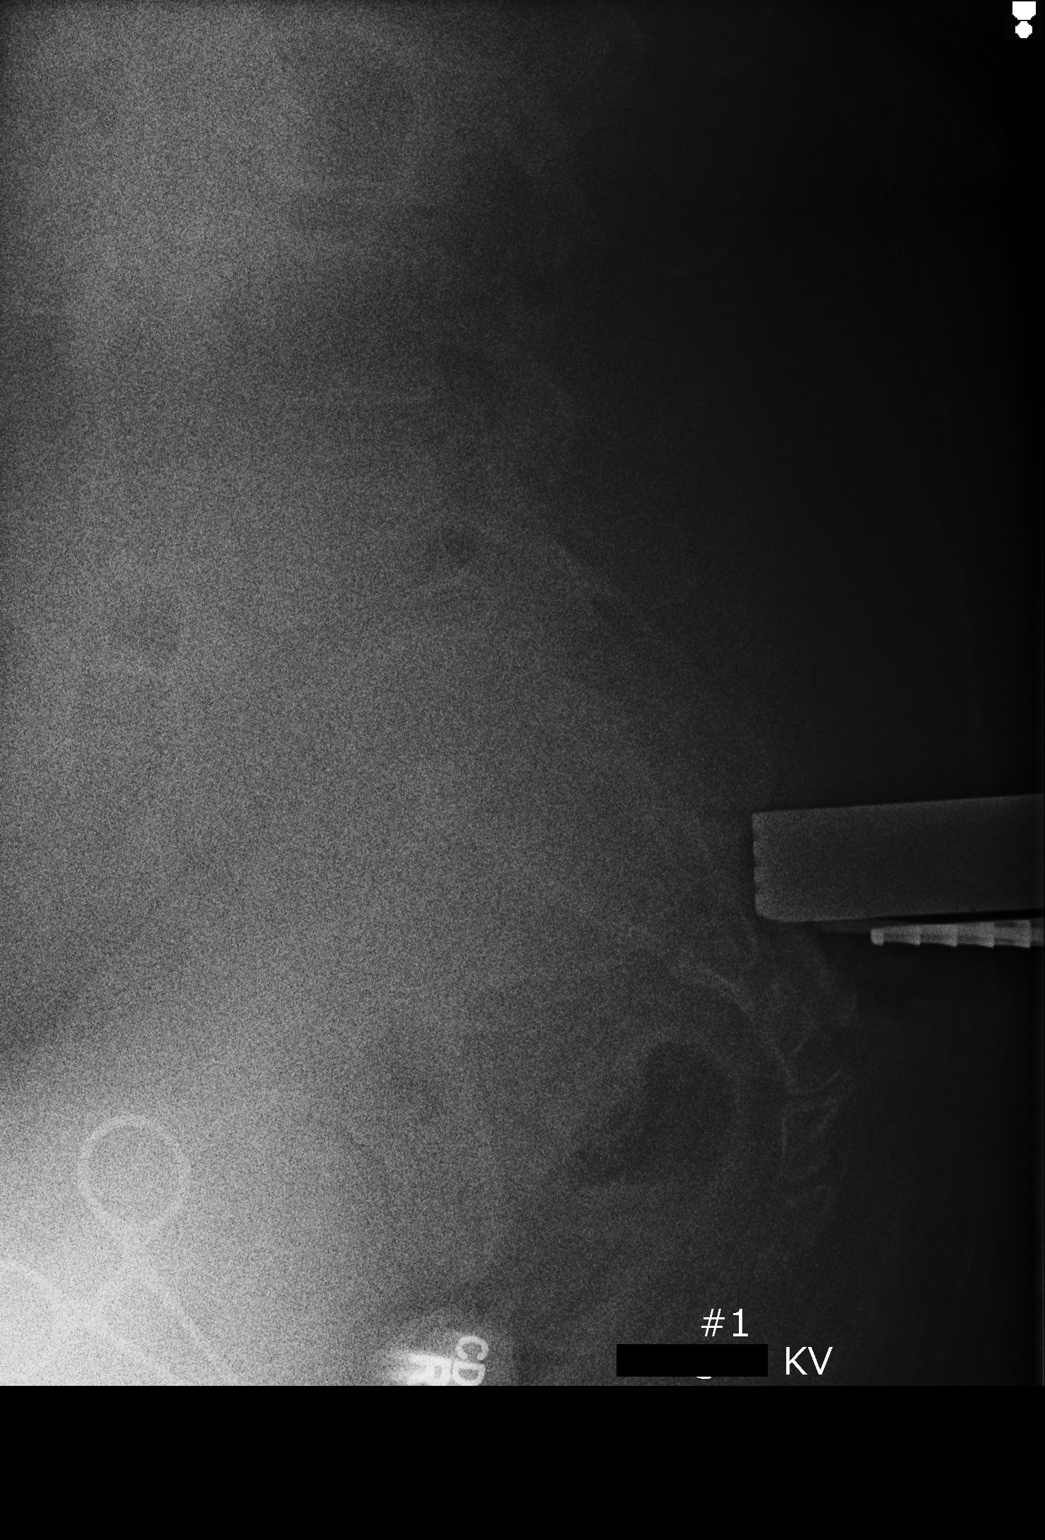

[lat (2 of 2)]
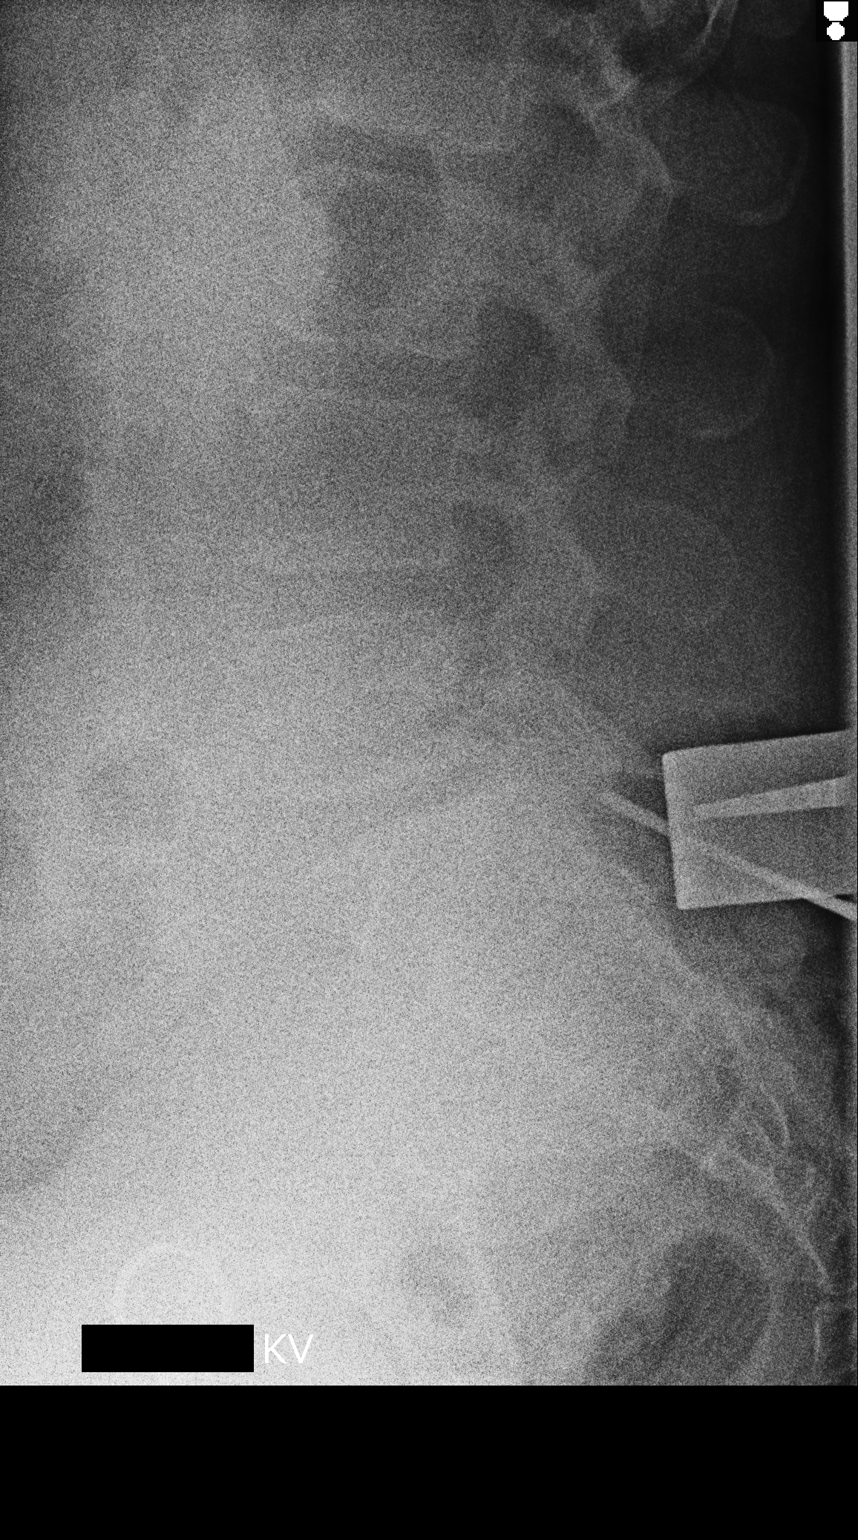

[2 of 2 positions shown; findings below may reference images not displayed]

FINDINGS: Cross-table lateral radiograph of the lumbosacral spine demonstrates
a metallic probe positioned at the L5-S1 interspace posteriorly.
Vertebral bodies are labeled. Soft tissue spreaders are located
posterior to the lumbosacral junction.
IMPRESSION: Intraoperative localization as above.

## 2016-09-18 ENCOUNTER — Encounter (HOSPITAL_COMMUNITY): Payer: Self-pay | Admitting: Nurse Practitioner

## 2016-09-18 ENCOUNTER — Emergency Department (HOSPITAL_COMMUNITY)
Admission: EM | Admit: 2016-09-18 | Discharge: 2016-09-18 | Disposition: A | Discharge status: Home / Self Care | Payer: Medicaid Other | Attending: Physician Assistant | Admitting: Physician Assistant

## 2016-09-18 DIAGNOSIS — I10 Essential (primary) hypertension: Secondary | ICD-10-CM | POA: Insufficient documentation

## 2016-09-18 DIAGNOSIS — Z79899 Other long term (current) drug therapy: Secondary | ICD-10-CM | POA: Insufficient documentation

## 2016-09-18 DIAGNOSIS — E119 Type 2 diabetes mellitus without complications: Secondary | ICD-10-CM | POA: Insufficient documentation

## 2016-09-18 DIAGNOSIS — R21 Rash and other nonspecific skin eruption: Secondary | ICD-10-CM | POA: Diagnosis present

## 2016-09-18 DIAGNOSIS — L03115 Cellulitis of right lower limb: Secondary | ICD-10-CM | POA: Diagnosis not present

## 2016-09-18 DIAGNOSIS — F319 Bipolar disorder, unspecified: Secondary | ICD-10-CM | POA: Diagnosis not present

## 2016-09-18 MED ORDER — SULFAMETHOXAZOLE-TRIMETHOPRIM 800-160 MG PO TABS
1.0000 | ORAL_TABLET | Freq: Once | ORAL | Status: AC
  Administered 2016-09-18: 1 via ORAL
  Filled 2016-09-18: qty 1

## 2016-09-18 MED ORDER — CEPHALEXIN 250 MG PO CAPS
500.0000 mg | ORAL_CAPSULE | Freq: Once | ORAL | Status: AC
  Administered 2016-09-18: 500 mg via ORAL
  Filled 2016-09-18: qty 2

## 2016-09-18 MED ORDER — CEPHALEXIN 500 MG PO CAPS: 500.0000 mg | ORAL_CAPSULE | Freq: Four times a day (QID) | ORAL | 0 refills | Status: DC

## 2016-09-18 MED ORDER — OXYCODONE-ACETAMINOPHEN 5-325 MG PO TABS
1.0000 | ORAL_TABLET | Freq: Once | ORAL | Status: AC
  Administered 2016-09-18: 1 via ORAL
  Filled 2016-09-18: qty 1

## 2016-09-18 MED ORDER — SULFAMETHOXAZOLE-TRIMETHOPRIM 800-160 MG PO TABS: 1.0000 | ORAL_TABLET | Freq: Two times a day (BID) | ORAL | 0 refills | Status: AC

## 2016-09-18 MED ORDER — LIDOCAINE HCL (PF) 1 % IJ SOLN
INTRAMUSCULAR | Status: AC
  Administered 2016-09-18: 17:00:00
  Filled 2016-09-18: qty 5

## 2016-09-18 NOTE — ED Provider Notes (Signed)
[facility]EMERGENCY DEPT Provider Note   CSN: [id number] Arrival date & time: 09/18/16  1342   By signing my name below, I, Clarisse Gouge, attest that this documentation has been prepared under the direction and in the presence of Torsha Lemus, Cindee Salt, MD. Electronically signed, Clarisse Gouge, ED Scribe. 09/18/16. 5:07 PM.  History   Chief Complaint Chief Complaint  Patient presents with  . Skin Problem   The history is provided by the patient, medical records and a relative. No language interpreter was used.    Carolyn Ingram is a 21 y.o. female with h/o DM and morbid obesity presenting to the Emergency Department concerning a raised, discolored, painful area of skin to the medial upper RLE that has gradually worsened x 1 week. Associated discoloration surrounding the aforementioned raised area. 5/10, constant throbbing described. Pt has applied warm compresses with no noted relief. H/o similar, less severe symptoms noted. No known trauma or injury. No drainage or any other complaints noted at this time.   Past Medical History:  Diagnosis Date  . Anemia   . Bipolar disorder (HCC)   . Diabetes mellitus without complication (HCC)   . Headache   . HNP (herniated nucleus pulposus)   . Hypertension   . Menorrhagia   . Morbid obesity with BMI of 50.0-59.9, adult (HCC)   . Numbness and tingling of leg    right  . OCD (obsessive compulsive disorder)   . Shortness of breath dyspnea   . Tourette's disorder   . Wears glasses     Patient Active Problem List   Diagnosis Date Noted  . Lumbar disc herniation with radiculopathy 02/08/2015    Past Surgical History:  Procedure Laterality Date  . LUMBAR LAMINECTOMY/DECOMPRESSION MICRODISCECTOMY Right 02/08/2015   Procedure: Microdiscectomy - right - L5-S1 ;  Surgeon: Julio Sicks, MD;  Location: [facility];  Service: Neurosurgery;  Laterality: Right;  Microdiscectomy - right - L5-S1     OB History    Gravida Para Term Preterm AB Living     0             SAB TAB Ectopic Multiple Live Births                   Home Medications    Prior to Admission medications   Medication Sig Start Date End Date Taking? Authorizing Provider  cephALEXin (KEFLEX) 500 MG capsule Take 1 capsule (500 mg total) by mouth 4 (four) times daily. 02/09/15   Julio Sicks, MD  diazepam (VALIUM) 5 MG tablet Take 1 tablet (5 mg total) by mouth every 6 (six) hours as needed for muscle spasms. 02/09/15   Julio Sicks, MD  lisinopril (PRINIVIL,ZESTRIL) 5 MG tablet Take 5 mg by mouth daily.    [provider]  oxyCODONE-acetaminophen (PERCOCET/ROXICET) 5-325 MG tablet Take 1-2 tablets by mouth every 6 (six) hours as needed for moderate pain. 02/09/15   Julio Sicks, MD    Family History Family History  Problem Relation Age of Onset  . Cancer Mother   . Diabetes Mother   . Hypertension Mother   . Cancer Father   . Diabetes Father   . Hypertension Father     Social History Social History  Substance Use Topics  . Smoking status: Never Smoker  . Smokeless tobacco: Never Used  . Alcohol use No     Allergies   Patient has no known allergies.   Review of Systems Review of Systems  Constitutional: Negative for fever.  Gastrointestinal: Negative for abdominal pain, nausea and vomiting.  Skin: Positive for color change. Negative for wound.  Allergic/Immunologic: Positive for immunocompromised state.  All other systems reviewed and are negative.    Physical Exam Updated Vital Signs BP (!) 145/92 (BP Location: Left Arm)   Pulse (!) 108   Temp 98.3 F (36.8 C) (Oral)   Resp 16   Ht 5\' 6"  (1.676 m)   Wt (!) 328 lb 2 oz (148.8 kg)   SpO2 95%   BMI 52.96 kg/m   Physical Exam  Constitutional: She is oriented to person, place, and time. She appears well-developed and well-nourished.  HENT:  Head: Normocephalic.  Eyes: Pupils are equal, round, and reactive to light. Conjunctivae and EOM are normal.  Neck: Normal range of motion.   Cardiovascular: Normal rate, regular rhythm and normal heart sounds.  Exam reveals no gallop and no friction rub.   No murmur heard. Pulmonary/Chest: Effort normal. No respiratory distress. She has no wheezes.  Abdominal: She exhibits no distension. There is no tenderness.  Musculoskeletal: Normal range of motion. She exhibits no tenderness.  Neurological: She is alert and oriented to person, place, and time.  Skin: Skin is warm. There is erythema.  large indurated area with no fluctuance but with 1 cm overlying thin walled bolus like structure.   Psychiatric: She has a normal mood and affect.  Nursing note and vitals reviewed.   ED Treatments / Results  DIAGNOSTIC STUDIES: Oxygen Saturation is 95% on RA, adequate by my interpretation.    COORDINATION OF CARE: 5:01 PM-Discussed next steps with pt. Pt verbalized understanding and is agreeable with the plan. Will order abx and pain medication. Pt prepared for d/c, advised of symptomatic care at home, F/U instructions and return precautions.    Labs (all labs ordered are listed, but only abnormal results are displayed) Labs Reviewed - No data to display  EKG  EKG Interpretation None       Radiology No results found.  Procedures .Marland KitchenIncision and Drainage Date/Time: 09/18/2016 4:58 PM Performed by: Bary Castilla LYN Authorized by: Bary Castilla LYN   Consent:    Consent obtained:  Verbal   Consent given by:  Patient and parent   Risks discussed:  Incomplete drainage and pain Location:    Type:  Abscess Pre-procedure details:    Skin preparation:  Antiseptic wash Anesthesia (see MAR for exact dosages):    Anesthesia method:  Local infiltration   Local anesthetic:  Lidocaine 1% WITH epi Procedure type:    Complexity:  Simple Procedure details:    Needle aspiration: no     Incision types:  Stab incision   Incision depth:  Dermal   Scalpel blade:  11   Wound management:  Probed and deloculated   Drainage:   Bloody   Drainage amount:  Scant   Wound treatment:  Drain placed   Packing materials:  1/2 in gauze Post-procedure details:    Patient tolerance of procedure:  Tolerated well, no immediate complications Comments:     No purulent discharge. De-roofing of bolus structure and I&D.    (including critical care time)  Medications Ordered in ED Medications - No data to display   Initial Impression / Assessment and Plan / ED Course  I have reviewed the triage vital signs and the nursing notes.  Pertinent labs & imaging results that were available during my care of the patient were reviewed by me and considered in my medical decision making (see chart for details).  I personally performed the services described in this documentation, which was scribed in my presence. The recorded information has been reviewed and is accurate.   Patient here with abscess/cellulitis to the right thigh. Gioven antibiotics. The bullous  structure was lanced. Incision made though no purulent discharge found.   Return precautions expressed  Final Clinical Impressions(s) / ED Diagnoses   Final diagnoses:  None    New Prescriptions New Prescriptions   No medications on file     Abelino DerrickMackuen, Roniyah Llorens Lyn, MD 09/18/16 2030

## 2016-09-18 NOTE — ED Triage Notes (Signed)
Pt presents with c/o lesion to right inner thigh. She first noticed the lesion one week ago and it has become increasingly red, swollen and painful since with a central sore filled with fluid. She has been applying warm compresses with no drainage or improvement

## 2016-09-18 NOTE — ED Notes (Signed)
Patient Alert and oriented X4. Stable and ambulatory. Patient verbalized understanding of the discharge instructions.  Patient belongings were taken by the patient.  

## 2016-09-18 NOTE — ED Notes (Signed)
Patient ambulatory to the room, no signs of distress noted at this time 

## 2016-09-18 NOTE — Discharge Instructions (Signed)
Use warm compresses to the site. Please use antibiotics as prescribed. Anticipate that should be getting better, but if it does not return to the emergency Department immediately.

## 2017-02-12 ENCOUNTER — Encounter (INDEPENDENT_AMBULATORY_CARE_PROVIDER_SITE_OTHER): Payer: Self-pay | Admitting: Orthopaedic Surgery

## 2017-02-12 ENCOUNTER — Ambulatory Visit (INDEPENDENT_AMBULATORY_CARE_PROVIDER_SITE_OTHER): Payer: Medicaid Other | Admitting: Orthopaedic Surgery

## 2017-02-12 DIAGNOSIS — G5601 Carpal tunnel syndrome, right upper limb: Secondary | ICD-10-CM

## 2017-02-12 NOTE — Progress Notes (Signed)
Office Visit Note   Patient: Carolyn Ingram           Date of Birth: 12-Mar-1995           MRN: [id number] Visit Date: 02/12/2017              Requested by: Jearld LeschWilliams, Dwight M, MD [address] [place], [place] [address] PCP: Jearld LeschWilliams, Dwight M, MD   Assessment & Plan: Visit Diagnoses:  1. Right carpal tunnel syndrome     Plan: Impression is right carpal tunnel syndrome.  EMG ordered.  Follow-up after the studies.  Follow-Up Instructions: Return if symptoms worsen or fail to improve.   Orders:  Orders Placed This Encounter  Procedures  . Ambulatory referral to Physical Medicine Rehab   No orders of the defined types were placed in this encounter.     Procedures: No procedures performed   Clinical Data: No additional findings.   Subjective: Chief Complaint  Patient presents with  . Right Hand - Pain    Patient is a 21 year old female comes in with right hand and middle and ring finger numbness.  Pain will radiate into her shoulder.  She endorses burning and tingling.  This is been going on for about 6 months and is worse at night.  She does wear carpal tunnel braces at night which do help.  She is right-hand dominant.  Denies any injuries.    Review of Systems  Constitutional: Negative.   HENT: Negative.   Eyes: Negative.   Respiratory: Negative.   Cardiovascular: Negative.   Endocrine: Negative.   Musculoskeletal: Negative.   Neurological: Negative.   Hematological: Negative.   Psychiatric/Behavioral: Negative.   All other systems reviewed and are negative.    Objective: Vital Signs: There were no vitals taken for this visit.  Physical Exam  Constitutional: She is oriented to person, place, and time. She appears well-developed and well-nourished.  HENT:  Head: Normocephalic and atraumatic.  Eyes: EOM are normal.  Neck: Neck supple.  Pulmonary/Chest: Effort normal.  Abdominal: Soft.  Neurological: She is alert and oriented to person, place,  and time.  Skin: Skin is warm. Capillary refill takes less than 2 seconds.  Psychiatric: She has a normal mood and affect. Her behavior is normal. Judgment and thought content normal.  Nursing note and vitals reviewed.   Ortho Exam Right hand exam shows no skin lesions or muscle atrophy.  Positive carpal tunnel compressive signs.  APB motor function intact. Specialty Comments:  No specialty comments available.  Imaging: No results found.   PMFS History: Patient Active Problem List   Diagnosis Date Noted  . 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 Anemia   . Bipolar disorder (HCC)   . Diabetes mellitus without complication (HCC)   . Headache   . HNP (herniated nucleus pulposus)   . Hypertension   . Menorrhagia   . Morbid obesity with BMI of 50.0-59.9, adult (HCC)   . Numbness and tingling of leg    right  . OCD (obsessive compulsive disorder)   . Shortness of breath dyspnea   . Tourette's disorder   . Wears glasses     Family History  Problem Relation Age of Onset  . Cancer Mother   . Diabetes Mother   . Hypertension Mother   . Cancer Father   . Diabetes Father   . Hypertension Father     Past Surgical History:  Procedure Laterality Date  .  LUMBAR LAMINECTOMY/DECOMPRESSION MICRODISCECTOMY Right 02/08/2015   Procedure: Microdiscectomy - right - L5-S1 ;  Surgeon: Julio SicksHenry Pool, MD;  Location: [facility];  Service: Neurosurgery;  Laterality: Right;  Microdiscectomy - right - L5-S1    Social History   Occupational History  . Not on file  Tobacco Use  . Smoking status: Never Smoker  . Smokeless tobacco: Never Used  Substance and Sexual Activity  . Alcohol use: No  . Drug use: No  . Sexual activity: Not on file

## 2017-03-02 ENCOUNTER — Encounter (INDEPENDENT_AMBULATORY_CARE_PROVIDER_SITE_OTHER): Payer: Self-pay | Admitting: Physical Medicine and Rehabilitation

## 2017-03-02 ENCOUNTER — Ambulatory Visit (INDEPENDENT_AMBULATORY_CARE_PROVIDER_SITE_OTHER): Payer: Medicaid Other | Admitting: Physical Medicine and Rehabilitation

## 2017-03-02 DIAGNOSIS — R202 Paresthesia of skin: Secondary | ICD-10-CM | POA: Diagnosis not present

## 2017-03-02 NOTE — Progress Notes (Deleted)
Pt states numbness and tingling in right shoulder that radiates to her right hand/digits. Pt states pain in right wrist. Pt states symptoms been present for about 6 months. Pt states symptoms get worse when she lay on her right side, nothing makes it better. Pt is right hand dominant. No lotions or creams on hands.

## 2017-03-05 NOTE — Progress Notes (Signed)
[name] - 22 y.o. female MRN [id number]  Date of birth: 02-Jun-1995  Office Visit Note: Visit Date: 03/02/2017 PCP: Jearld Lesch, MD Referred by: Jearld Lesch, MD  Subjective: Chief Complaint  Patient presents with  . Right Shoulder - Numbness, Tingling  . Right Wrist - Pain  . Right Hand - Numbness, Tingling   HPI: Ms. Emory is a right-hand-dominant 22 year old female who comes in today at the request of Dr. Roda Shutters for electrodiagnostic study of the right upper extremity.  He reports pain numbness and tingling in the right shoulder for somewhat nondermatomal complaints and right wrist pain.  Most of the tingling and numbness is in the middle 2 digits.  He states his symptoms have been present for 6 months or more.  She reports worsening symptoms when she lays on her right side and nothing seems to make it any better.  She has not been wearing any wrist splints.  She has no prior history of cervical spine problems.  She has been followed by Dr. Dutch Quint for her lower back and she had a lumbar microdiscectomy performed.  Her course is complicated by bipolar disorder as well as OCD and obesity.  She has no history of diabetes but it does run in her family.    ROS Otherwise per HPI.  Assessment & Plan: Visit Diagnoses:  1. Paresthesia of skin     Plan: No additional findings.  Impression: The above electrodiagnostic study is ABNORMAL and reveals evidence of a moderate right median nerve entrapment at the wrist (carpal tunnel syndrome) affecting sensory and motor components.   There is no significant electrodiagnostic evidence of any other focal nerve entrapment, brachial plexopathy or cervical radiculopathy.   Recommendations: 1.  Follow-up with referring physician. 2.  Continue current management of symptoms. 3.  Continue use of resting splint at night-time and as needed during the day. 4.  Suggest surgical evaluation.    Meds & Orders: No orders of the defined types  were placed in this encounter.   Orders Placed This Encounter  Procedures  . NCV with EMG (electromyography)    Follow-up: Return for Dr. Roda Shutters.   Procedures: No procedures performed  EMG & NCV Findings: Evaluation of the right median motor nerve showed prolonged distal onset latency (6.6 ms) and decreased conduction velocity (Elbow-Wrist, 43 m/s).  The right median (across palm) sensory nerve showed prolonged distal peak latency (Wrist, 9.2 ms), reduced amplitude (4.0 V), and prolonged distal peak latency (Palm, 3.0 ms).  All remaining nerves (as indicated in the following tables) were within normal limits.    All examined muscles (as indicated in the following table) showed no evidence of electrical instability.    Impression: The above electrodiagnostic study is ABNORMAL and reveals evidence of a moderate right median nerve entrapment at the wrist (carpal tunnel syndrome) affecting sensory and motor components.   There is no significant electrodiagnostic evidence of any other focal nerve entrapment, brachial plexopathy or cervical radiculopathy.   Recommendations: 1.  Follow-up with referring physician. 2.  Continue current management of symptoms. 3.  Continue use of resting splint at night-time and as needed during the day. 4.  Suggest surgical evaluation.    Nerve Conduction Studies Anti Sensory Summary Table   Stim Site NR Peak (ms) Norm Peak (ms) P-T Amp (V) Norm P-T Amp Site1 Site2 Delta-P (ms) Dist (cm) Vel (m/s) Norm Vel (m/s)  Right Median Acr Palm Anti Sensory (2nd Digit)  32.5C  Wrist    *  9.2 <3.6 *4.0 >10 Wrist Palm 6.2 0.0    Palm    *3.0 <2.0 5.1         Right Radial Anti Sensory (Base 1st Digit)  32C  Wrist    2.0 <3.1 5.3  Wrist Base 1st Digit 2.0 0.0    Right Ulnar Anti Sensory (5th Digit)  32.5C  Wrist    3.4 <3.7 26.3 >15.0 Wrist 5th Digit 3.4 14.0 41 >38   Motor Summary Table   Stim Site NR Onset (ms) Norm Onset (ms) O-P Amp (mV) Norm O-P Amp Site1  Site2 Delta-0 (ms) Dist (cm) Vel (m/s) Norm Vel (m/s)  Right Median Motor (Abd Poll Brev)  32.2C  Wrist    *6.6 <4.2 6.8 >5 Elbow Wrist 5.2 22.5 *43 >50  Elbow    11.8  6.7         Right Ulnar Motor (Abd Dig Min)  32C  Wrist    3.0 <4.2 7.2 >3 B Elbow Wrist 3.8 20.5 54 >53  B Elbow    6.8  7.5  A Elbow B Elbow 1.4 10.5 75 >53  A Elbow    8.2  6.9          EMG   Side Muscle Nerve Root Ins Act Fibs Psw Amp Dur Poly Recrt Int Dennie BiblePat Comment  Right Abd Poll Brev Median C8-T1 Nml Nml Nml Nml Nml 0 Nml Nml   Right 1stDorInt Ulnar C8-T1 Nml Nml Nml Nml Nml 0 Nml Nml   Right PronatorTeres Median C6-7 Nml Nml Nml Nml Nml 0 Nml Nml   Right Biceps Musculocut C5-6 Nml Nml Nml Nml Nml 0 Nml Nml   Right Deltoid Axillary C5-6 Nml Nml Nml Nml Nml 0 Nml Nml     Nerve Conduction Studies Anti Sensory Left/Right Comparison   Stim Site L Lat (ms) R Lat (ms) L-R Lat (ms) L Amp (V) R Amp (V) L-R Amp (%) Site1 Site2 L Vel (m/s) R Vel (m/s) L-R Vel (m/s)  Median Acr Palm Anti Sensory (2nd Digit)  32.5C  Wrist  *9.2   *4.0  Wrist Palm     Palm  *3.0   5.1        Radial Anti Sensory (Base 1st Digit)  32C  Wrist  2.0   5.3  Wrist Base 1st Digit     Ulnar Anti Sensory (5th Digit)  32.5C  Wrist  3.4   26.3  Wrist 5th Digit  41    Motor Left/Right Comparison   Stim Site L Lat (ms) R Lat (ms) L-R Lat (ms) L Amp (mV) R Amp (mV) L-R Amp (%) Site1 Site2 L Vel (m/s) R Vel (m/s) L-R Vel (m/s)  Median Motor (Abd Poll Brev)  32.2C  Wrist  *6.6   6.8  Elbow Wrist  *43   Elbow  11.8   6.7        Ulnar Motor (Abd Dig Min)  32C  Wrist  3.0   7.2  B Elbow Wrist  54   B Elbow  6.8   7.5  A Elbow B Elbow  75   A Elbow  8.2   6.9           Waveforms:            Clinical History: No specialty comments available.  She reports that  has never smoked. she has never used smokeless tobacco. No results for input(s): HGBA1C, LABURIC in the last 8760 hours.  Objective:  VS:  HT:    WT:   BMI:     BP:   HR:  bpm  TEMP: ( )  RESP:  Physical Exam  Musculoskeletal:  Inspection reveals no atrophy of the bilateral APB or FDI or hand intrinsics. There is no swelling, color changes, allodynia or dystrophic changes. There is 5 out of 5 strength in the bilateral wrist extension, finger abduction and long finger flexion. There is intact sensation to light touch in all dermatomal and peripheral nerve distributions.  There is a positive Phalen's test on the right. There is a negative Hoffmann's test bilaterally.    Ortho Exam Imaging: No results found.  Past Medical/Family/Surgical/Social History: Medications & Allergies reviewed per EMR Patient Active Problem List   Diagnosis Date Noted  . 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 Anemia   . Bipolar disorder (HCC)   . Diabetes mellitus without complication (HCC)   . Headache   . HNP (herniated nucleus pulposus)   . Hypertension   . Menorrhagia   . Morbid obesity with BMI of 50.0-59.9, adult (HCC)   . Numbness and tingling of leg    right  . OCD (obsessive compulsive disorder)   . Shortness of breath dyspnea   . Tourette's disorder   . Wears glasses    Family History  Problem Relation Age of Onset  . Cancer Mother   . Diabetes Mother   . Hypertension Mother   . Cancer Father   . Diabetes Father   . Hypertension Father    Past Surgical History:  Procedure Laterality Date  . LUMBAR LAMINECTOMY/DECOMPRESSION MICRODISCECTOMY Right 02/08/2015   Procedure: Microdiscectomy - right - L5-S1 ;  Surgeon: Julio Sicks, MD;  Location: [facility];  Service: Neurosurgery;  Laterality: Right;  Microdiscectomy - right - L5-S1    Social History   Occupational History  . Not on file  Tobacco Use  . Smoking status: Never Smoker  . Smokeless tobacco: Never Used  Substance and Sexual Activity  . Alcohol use: No  . Drug use: No  . Sexual activity: Not on file

## 2017-03-05 NOTE — Procedures (Signed)
EMG & NCV Findings: Evaluation of the right median motor nerve showed prolonged distal onset latency (6.6 ms) and decreased conduction velocity (Elbow-Wrist, 43 m/s).  The right median (across palm) sensory nerve showed prolonged distal peak latency (Wrist, 9.2 ms), reduced amplitude (4.0 V), and prolonged distal peak latency (Palm, 3.0 ms).  All remaining nerves (as indicated in the following tables) were within normal limits.    All examined muscles (as indicated in the following table) showed no evidence of electrical instability.    Impression: The above electrodiagnostic study is ABNORMAL and reveals evidence of a moderate right median nerve entrapment at the wrist (carpal tunnel syndrome) affecting sensory and motor components.   There is no significant electrodiagnostic evidence of any other focal nerve entrapment, brachial plexopathy or cervical radiculopathy.   Recommendations: 1.  Follow-up with referring physician. 2.  Continue current management of symptoms. 3.  Continue use of resting splint at night-time and as needed during the day. 4.  Suggest surgical evaluation.    Nerve Conduction Studies Anti Sensory Summary Table   Stim Site NR Peak (ms) Norm Peak (ms) P-T Amp (V) Norm P-T Amp Site1 Site2 Delta-P (ms) Dist (cm) Vel (m/s) Norm Vel (m/s)  Right Median Acr Palm Anti Sensory (2nd Digit)  32.5C  Wrist    *9.2 <3.6 *4.0 >10 Wrist Palm 6.2 0.0    Palm    *3.0 <2.0 5.1         Right Radial Anti Sensory (Base 1st Digit)  32C  Wrist    2.0 <3.1 5.3  Wrist Base 1st Digit 2.0 0.0    Right Ulnar Anti Sensory (5th Digit)  32.5C  Wrist    3.4 <3.7 26.3 >15.0 Wrist 5th Digit 3.4 14.0 41 >38   Motor Summary Table   Stim Site NR Onset (ms) Norm Onset (ms) O-P Amp (mV) Norm O-P Amp Site1 Site2 Delta-0 (ms) Dist (cm) Vel (m/s) Norm Vel (m/s)  Right Median Motor (Abd Poll Brev)  32.2C  Wrist    *6.6 <4.2 6.8 >5 Elbow Wrist 5.2 22.5 *43 >50  Elbow    11.8  6.7         Right  Ulnar Motor (Abd Dig Min)  32C  Wrist    3.0 <4.2 7.2 >3 B Elbow Wrist 3.8 20.5 54 >53  B Elbow    6.8  7.5  A Elbow B Elbow 1.4 10.5 75 >53  A Elbow    8.2  6.9          EMG   Side Muscle Nerve Root Ins Act Fibs Psw Amp Dur Poly Recrt Int Dennie Bible Comment  Right Abd Poll Brev Median C8-T1 Nml Nml Nml Nml Nml 0 Nml Nml   Right 1stDorInt Ulnar C8-T1 Nml Nml Nml Nml Nml 0 Nml Nml   Right PronatorTeres Median C6-7 Nml Nml Nml Nml Nml 0 Nml Nml   Right Biceps Musculocut C5-6 Nml Nml Nml Nml Nml 0 Nml Nml   Right Deltoid Axillary C5-6 Nml Nml Nml Nml Nml 0 Nml Nml     Nerve Conduction Studies Anti Sensory Left/Right Comparison   Stim Site L Lat (ms) R Lat (ms) L-R Lat (ms) L Amp (V) R Amp (V) L-R Amp (%) Site1 Site2 L Vel (m/s) R Vel (m/s) L-R Vel (m/s)  Median Acr Palm Anti Sensory (2nd Digit)  32.5C  Wrist  *9.2   *4.0  Wrist Palm     Palm  *3.0   5.1  Radial Anti Sensory (Base 1st Digit)  32C  Wrist  2.0   5.3  Wrist Base 1st Digit     Ulnar Anti Sensory (5th Digit)  32.5C  Wrist  3.4   26.3  Wrist 5th Digit  41    Motor Left/Right Comparison   Stim Site L Lat (ms) R Lat (ms) L-R Lat (ms) L Amp (mV) R Amp (mV) L-R Amp (%) Site1 Site2 L Vel (m/s) R Vel (m/s) L-R Vel (m/s)  Median Motor (Abd Poll Brev)  32.2C  Wrist  *6.6   6.8  Elbow Wrist  *43   Elbow  11.8   6.7        Ulnar Motor (Abd Dig Min)  32C  Wrist  3.0   7.2  B Elbow Wrist  54   B Elbow  6.8   7.5  A Elbow B Elbow  75   A Elbow  8.2   6.9           Waveforms:

## 2017-03-12 ENCOUNTER — Ambulatory Visit (INDEPENDENT_AMBULATORY_CARE_PROVIDER_SITE_OTHER): Payer: Medicaid Other | Admitting: Orthopaedic Surgery

## 2017-03-12 ENCOUNTER — Encounter (INDEPENDENT_AMBULATORY_CARE_PROVIDER_SITE_OTHER): Payer: Self-pay | Admitting: Orthopaedic Surgery

## 2017-03-12 DIAGNOSIS — G5601 Carpal tunnel syndrome, right upper limb: Secondary | ICD-10-CM | POA: Diagnosis not present

## 2017-03-12 NOTE — Progress Notes (Signed)
   Office Visit Note   Patient: Carolyn Ingram           Date of Birth: 1996-02-22           MRN: [id number] Visit Date: 03/12/2017              Requested by: Jearld LeschWilliams, Dwight M, MD [address] [place], [place] [id number] PCP: Jearld LeschWilliams, Dwight M, MD   Assessment & Plan: Visit Diagnoses:  1. Right carpal tunnel syndrome     Plan: Impression is 22 year old female with moderate right carpal tunnel syndrome.  I am not overly convinced that her diabetes is well controlled therefore like to obtain a hemoglobin A1c before we consider surgery versus cortisone injections.  She is agreeable to this.  We will see her back in a week to review the lab results.  Follow-Up Instructions: Return in about 1 week (around 03/19/2017).   Orders:  Orders Placed This Encounter  Procedures  . HgB A1c   No orders of the defined types were placed in this encounter.     Procedures: No procedures performed   Clinical Data: No additional findings.   Subjective: Chief Complaint  Patient presents with  . Right Hand - Pain    Patient follows up today to review her nerve conduction studies.    Review of Systems   Objective: Vital Signs: There were no vitals taken for this visit.  Physical Exam  Ortho Exam Exam is stable. Specialty Comments:  No specialty comments available.  Imaging: No results found.   PMFS History: Patient Active Problem List   Diagnosis Date Noted  . 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 Anemia   . Bipolar disorder (HCC)   . Diabetes mellitus without complication (HCC)   . Headache   . HNP (herniated nucleus pulposus)   . Hypertension   . Menorrhagia   . Morbid obesity with BMI of 50.0-59.9, adult (HCC)   . Numbness and tingling of leg    right  . OCD (obsessive compulsive disorder)   . Shortness of breath dyspnea   . Tourette's disorder   . Wears glasses     Family History  Problem  Relation Age of Onset  . Cancer Mother   . Diabetes Mother   . Hypertension Mother   . Cancer Father   . Diabetes Father   . Hypertension Father     Past Surgical History:  Procedure Laterality Date  . LUMBAR LAMINECTOMY/DECOMPRESSION MICRODISCECTOMY Right 02/08/2015   Procedure: Microdiscectomy - right - L5-S1 ;  Surgeon: Julio SicksHenry Pool, MD;  Location: [facility];  Service: Neurosurgery;  Laterality: Right;  Microdiscectomy - right - L5-S1    Social History   Occupational History  . Not on file  Tobacco Use  . Smoking status: Never Smoker  . Smokeless tobacco: Never Used  Substance and Sexual Activity  . Alcohol use: No  . Drug use: No  . Sexual activity: Not on file

## 2017-03-12 NOTE — Progress Notes (Signed)
he

## 2017-03-13 LAB — HEMOGLOBIN A1C
EAG (MMOL/L): 18.7 (calc)
HEMOGLOBIN A1C: 13.4 %{Hb} — AB (ref <5.7)
MEAN PLASMA GLUCOSE: 338 (calc)

## 2017-03-13 LAB — TIQ-NTM

## 2017-03-13 NOTE — Progress Notes (Signed)
Her diabetes is way out of control.  She cannot have cortisone injection or surgery.  She can follow up with us once her diabetes is under better control.  A1c has to be below 8.0

## 2017-03-19 ENCOUNTER — Ambulatory Visit (INDEPENDENT_AMBULATORY_CARE_PROVIDER_SITE_OTHER): Payer: Medicaid Other | Admitting: Orthopaedic Surgery

## 2017-04-25 ENCOUNTER — Other Ambulatory Visit: Payer: Self-pay

## 2017-04-25 ENCOUNTER — Encounter (HOSPITAL_COMMUNITY): Payer: Self-pay | Admitting: *Deleted

## 2017-04-25 NOTE — Progress Notes (Signed)
Pt denies SOB, chest pain, and being under the care of a cardiologist. Pt denies having a stress test, echo and cardiac cath,. Pt denies having an EKG and chest x ray within the last year. Pt denies recent labs. Pt made aware to stop taking Aspirin, vitamins, fish oil and herbal medications. Do not take any NSAIDs ie: Ibuprofen, Advil, Naproxen (Aleve), Motrin, BC and Goody Powder. Pt made aware to not take Glipizide the night before surgery and the morning of surgery. Pt made aware to not take Metformin DOS. surgery.Pt made aware to check BG every 2 hours prior to arrival to hospital on DOS. Pt made aware to treat a BG < 70 with  4 ounces of apple or cranberry juice, wait 15 minutes after intervention to recheck BG, if BG remains < 70, call Short Stay unit to speak with a nurse. Pt verbalized understanding of all pre-op instructions. Anesthesia asked to review pt A1c result of 13.4

## 2017-04-25 NOTE — H&P (Signed)
HISTORY AND PHYSICAL  Carolyn Ingram is a 22 y.o. female patient with CC: painful teeth.  No diagnosis found.  Past Medical History:  Diagnosis Date  . Anemia   . Bipolar disorder (HCC)   . Diabetes mellitus without complication (HCC)   . Headache   . HNP (herniated nucleus pulposus)   . Hypertension   . Menorrhagia   . Morbid obesity with BMI of 50.0-59.9, adult (HCC)   . Numbness and tingling of leg    right  . OCD (obsessive compulsive disorder)   . Shortness of breath dyspnea   . Tourette's disorder   . Wears glasses     No current facility-administered medications for this encounter.    Current Outpatient Medications  Medication Sig Dispense Refill  . fluvoxaMINE (LUVOX) 50 MG tablet Take 50 mg by mouth daily.  0  . glipiZIDE (GLUCOTROL) 5 MG tablet Take 5 mg by mouth daily.  1  . lisinopril-hydrochlorothiazide (PRINZIDE,ZESTORETIC) 10-12.5 MG tablet Take 1 tablet by mouth daily.    . metFORMIN (GLUCOPHAGE) 1000 MG tablet Take 1,000 mg by mouth 2 (two) times daily.  2  . pravastatin (PRAVACHOL) 20 MG tablet Take 20 mg by mouth daily.  2  . traZODone (DESYREL) 50 MG tablet Take 50 mg by mouth at bedtime.  0  . diazepam (VALIUM) 5 MG tablet Take 1 tablet (5 mg total) by mouth every 6 (six) hours as needed for muscle spasms. (Patient not taking: Reported on 02/12/2017) 60 tablet 0  . oxyCODONE-acetaminophen (PERCOCET/ROXICET) 5-325 MG tablet Take 1-2 tablets by mouth every 6 (six) hours as needed for moderate pain. (Patient not taking: Reported on 02/12/2017) 80 tablet 0   No Known Allergies Active Problems:   * No active hospital problems. *  Vitals: There were no vitals taken for this visit. Lab results:No results found for this or any previous visit (from the past 24 hour(s)). Radiology Results: No results found. General appearance: alert, cooperative, no distress and morbidly obese Head: Normocephalic, without obvious abnormality, atraumatic Eyes: negative Nose:  Nares normal. Septum midline. Mucosa normal. No drainage or sinus tenderness. Throat: partially erupted third molars with decay. Large decay tooth # 31. No fluctuance, edema, trismus. Pharynx clear. Neck: no adenopathy, supple, symmetrical, trachea midline and thyroid not enlarged, symmetric, no tenderness/mass/nodules Resp: clear to auscultation bilaterally Cardio: regular rate and rhythm, S1, S2 normal, no murmur, click, rub or gallop  Assessment: Nonrestorable teeth # 1, 16, 17, 31, 32  Plan: Dental extractions with GA. Nasal. Day surgery.  Ocie DoyneScott Lux Skilton 04/25/2017

## 2017-04-26 MED ORDER — DEXTROSE 5 % IV SOLN
3.0000 g | INTRAVENOUS | Status: AC
  Administered 2017-04-27: 3 g via INTRAVENOUS
  Filled 2017-04-26: qty 3

## 2017-04-26 NOTE — Progress Notes (Signed)
Anesthesia Chart Review:  Pt is a same day work up  Pt is a 22 year old female scheduled for dental restoration/extractions on 04/27/2017 with Ocie DoyneScott Jensen, DDS  - Receives primary care at [facility]  PMH includes:  HTN, DM, anemia, bipolar disorder, OCD, Tourette's. Never smoker. BMI 23. S/p microdiscectomy 02/08/15  Medications include: fluvoxamine, glipizide, lisinopril-HCTZ, metformin, pravastatin  Labs will be obtained day of surgery - HbA1c was 12.4 on 04/06/17 - Dr. Barbette MerinoJensen is aware of uncontrolled DM  EKG will be obtained day of surgery.   If labs and EKG acceptable day of surgery, I anticipate pt can proceed with surgery as scheduled.   Rica Mastngela Zyaire Dumas, FNP-BC [facility]Anesthesiology Phone: [phone] 04/26/2017 3:38 PM

## 2017-04-26 NOTE — Anesthesia Preprocedure Evaluation (Addendum)
Anesthesia Evaluation  Patient identified by MRN, date of birth, ID band Patient awake    Reviewed: Allergy & Precautions, H&P , NPO status , Patient's Chart, lab work & pertinent test results  Airway Mallampati: I  TM Distance: >3 FB Neck ROM: Full    Dental no notable dental hx. (+) Teeth Intact, Dental Advisory Given   Pulmonary neg pulmonary ROS,    Pulmonary exam normal breath sounds clear to auscultation       Cardiovascular hypertension, Pt. on medications  Rhythm:Regular Rate:Normal     Neuro/Psych  Headaches, PSYCHIATRIC DISORDERS Anxiety Bipolar Disorder OCDTOURETTE'S SYNDROME  Neuromuscular disease    GI/Hepatic negative GI ROS, Neg liver ROS,   Endo/Other  diabetes, Type 2, Oral Hypoglycemic AgentsMorbid obesity  Renal/GU negative Renal ROS  negative genitourinary   Musculoskeletal   Abdominal (+) + obese,  Abdomen: soft. Bowel sounds: normal.  Peds  Hematology negative hematology ROS (+)   Anesthesia Other Findings   Reproductive/Obstetrics negative OB ROS                            BP Readings from Last 3 Encounters:  09/18/16 (!) 145/92  02/09/15 (!) 105/55  02/01/15 (!) 143/90   Lab Results  Component Value Date   WBC 10.9 (H) 02/01/2015   HGB 13.9 02/01/2015   HCT 42.6 02/01/2015   MCV 88.0 02/01/2015   PLT 326 02/01/2015     Chemistry      Component Value Date/Time   NA 137 02/01/2015 1308   K 3.9 02/01/2015 1308   CL 100 (L) 02/01/2015 1308   CO2 27 02/01/2015 1308   BUN 5 (L) 02/01/2015 1308   CREATININE 0.65 02/01/2015 1308      Component Value Date/Time   CALCIUM 10.2 02/01/2015 1308       Anesthesia Physical  Anesthesia Plan  ASA: III  Anesthesia Plan: General   Post-op Pain Management:    Induction: Intravenous  PONV Risk Score and Plan:   Airway Management Planned: Oral ETT  Additional Equipment:   Intra-op Plan:    Post-operative Plan: Extubation in OR  Informed Consent: I have reviewed the patients History and Physical, chart, labs and discussed the procedure including the risks, benefits and alternatives for the proposed anesthesia with the patient or authorized representative who has indicated his/her understanding and acceptance.   Dental advisory given  Plan Discussed with: CRNA  Anesthesia Plan Comments:         Anesthesia Quick Evaluation

## 2017-04-26 NOTE — Anesthesia Preprocedure Evaluation (Deleted)
Anesthesia Evaluation  Patient identified by MRN, date of birth, ID band Patient awake    Reviewed: Allergy & Precautions, NPO status , Patient's Chart, lab work & pertinent test results  Airway        Dental   Pulmonary shortness of breath,           Cardiovascular hypertension,      Neuro/Psych  Headaches, PSYCHIATRIC DISORDERS Anxiety Bipolar Disorder OCDTOURETTE'S DISORDER    GI/Hepatic   Endo/Other  diabetesMorbid obesity  Renal/GU      Musculoskeletal   Abdominal   Peds  Hematology   Anesthesia Other Findings   Reproductive/Obstetrics                                                              Anesthesia Evaluation  Patient identified by MRN, date of birth, ID band Patient awake    Reviewed: Allergy & Precautions, H&P , NPO status , Patient's Chart, lab work & pertinent test results  Airway Mallampati: I  TM Distance: >3 FB Neck ROM: Full    Dental no notable dental hx. (+) Teeth Intact, Dental Advisory Given   Pulmonary neg pulmonary ROS,    Pulmonary exam normal breath sounds clear to auscultation       Cardiovascular hypertension, Pt. on medications  Rhythm:Regular Rate:Normal     Neuro/Psych  Headaches, Bipolar Disorder    GI/Hepatic negative GI ROS, Neg liver ROS,   Endo/Other  diabetes, Type 2, Oral Hypoglycemic AgentsMorbid obesity  Renal/GU negative Renal ROS  negative genitourinary   Musculoskeletal   Abdominal (+) + obese,  Abdomen: soft. Bowel sounds: normal.  Peds  Hematology negative hematology ROS (+)   Anesthesia Other Findings   Reproductive/Obstetrics negative OB ROS                           BP Readings from Last 3 Encounters:  09/18/16 (!) 145/92  02/09/15 (!) 105/55  02/01/15 (!) 143/90   Lab Results  Component Value Date   WBC 10.9 (H) 02/01/2015   HGB 13.9 02/01/2015   HCT 42.6  02/01/2015   MCV 88.0 02/01/2015   PLT 326 02/01/2015     Chemistry      Component Value Date/Time   NA 137 02/01/2015 1308   K 3.9 02/01/2015 1308   CL 100 (L) 02/01/2015 1308   CO2 27 02/01/2015 1308   BUN 5 (L) 02/01/2015 1308   CREATININE 0.65 02/01/2015 1308      Component Value Date/Time   CALCIUM 10.2 02/01/2015 1308       Anesthesia Physical Anesthesia Plan  ASA: III  Anesthesia Plan: General   Post-op Pain Management:    Induction: Intravenous  Airway Management Planned: Oral ETT  Additional Equipment:   Intra-op Plan:   Post-operative Plan: Extubation in OR  Informed Consent: I have reviewed the patients History and Physical, chart, labs and discussed the procedure including the risks, benefits and alternatives for the proposed anesthesia with the patient or authorized representative who has indicated his/her understanding and acceptance.   Dental advisory given  Plan Discussed with: CRNA  Anesthesia Plan Comments:         Anesthesia Quick Evaluation  Anesthesia Physical Anesthesia Plan  ASA: III  Anesthesia Plan:  General   Post-op Pain Management:    Induction: Intravenous  PONV Risk Score and Plan: Treatment may vary due to age or medical condition, Ondansetron and Dexamethasone  Airway Management Planned: Oral ETT  Additional Equipment:   Intra-op Plan:   Post-operative Plan: Extubation in OR  Informed Consent: I have reviewed the patients History and Physical, chart, labs and discussed the procedure including the risks, benefits and alternatives for the proposed anesthesia with the patient or authorized representative who has indicated his/her understanding and acceptance.   Dental advisory given  Plan Discussed with:   Anesthesia Plan Comments:         Anesthesia Quick Evaluation

## 2017-04-27 ENCOUNTER — Ambulatory Visit (HOSPITAL_COMMUNITY): Payer: Medicaid Other | Admitting: Emergency Medicine

## 2017-04-27 ENCOUNTER — Encounter (HOSPITAL_COMMUNITY)
Admission: RE | Disposition: A | Discharge status: Home / Self Care | Payer: Self-pay | Source: Ambulatory Visit | Attending: Oral Surgery

## 2017-04-27 ENCOUNTER — Ambulatory Visit (HOSPITAL_COMMUNITY)
Admission: RE | Admit: 2017-04-27 | Discharge: 2017-04-27 | Disposition: A | Discharge status: Home / Self Care | Payer: Medicaid Other | Source: Ambulatory Visit | Attending: Oral Surgery | Admitting: Oral Surgery

## 2017-04-27 ENCOUNTER — Encounter (HOSPITAL_COMMUNITY): Payer: Self-pay

## 2017-04-27 ENCOUNTER — Other Ambulatory Visit: Payer: Self-pay

## 2017-04-27 DIAGNOSIS — N920 Excessive and frequent menstruation with regular cycle: Secondary | ICD-10-CM | POA: Insufficient documentation

## 2017-04-27 DIAGNOSIS — Z6841 Body Mass Index (BMI) 40.0 and over, adult: Secondary | ICD-10-CM | POA: Diagnosis not present

## 2017-04-27 DIAGNOSIS — F429 Obsessive-compulsive disorder, unspecified: Secondary | ICD-10-CM | POA: Diagnosis not present

## 2017-04-27 DIAGNOSIS — F952 Tourette's disorder: Secondary | ICD-10-CM | POA: Diagnosis not present

## 2017-04-27 DIAGNOSIS — Z7984 Long term (current) use of oral hypoglycemic drugs: Secondary | ICD-10-CM | POA: Diagnosis not present

## 2017-04-27 DIAGNOSIS — R0600 Dyspnea, unspecified: Secondary | ICD-10-CM | POA: Diagnosis not present

## 2017-04-27 DIAGNOSIS — R51 Headache: Secondary | ICD-10-CM | POA: Diagnosis not present

## 2017-04-27 DIAGNOSIS — I10 Essential (primary) hypertension: Secondary | ICD-10-CM | POA: Insufficient documentation

## 2017-04-27 DIAGNOSIS — E119 Type 2 diabetes mellitus without complications: Secondary | ICD-10-CM | POA: Insufficient documentation

## 2017-04-27 DIAGNOSIS — F319 Bipolar disorder, unspecified: Secondary | ICD-10-CM | POA: Diagnosis not present

## 2017-04-27 DIAGNOSIS — K029 Dental caries, unspecified: Secondary | ICD-10-CM | POA: Diagnosis present

## 2017-04-27 DIAGNOSIS — Z79899 Other long term (current) drug therapy: Secondary | ICD-10-CM | POA: Diagnosis not present

## 2017-04-27 HISTORY — DX: Impacted teeth: K01.1

## 2017-04-27 HISTORY — PX: TOOTH EXTRACTION: SHX859

## 2017-04-27 HISTORY — DX: Dental caries, unspecified: K02.9

## 2017-04-27 LAB — CBC
HCT: 41.1 % (ref 36.0–46.0)
HEMOGLOBIN: 13.8 g/dL (ref 12.0–15.0)
MCH: 29.5 pg (ref 26.0–34.0)
MCHC: 33.6 g/dL (ref 30.0–36.0)
MCV: 87.8 fL (ref 78.0–100.0)
Platelets: 319 10*3/uL (ref 150–400)
RBC: 4.68 MIL/uL (ref 3.87–5.11)
RDW: 13.2 % (ref 11.5–15.5)
WBC: 10.6 10*3/uL — ABNORMAL HIGH (ref 4.0–10.5)

## 2017-04-27 LAB — BASIC METABOLIC PANEL
Anion gap: 14 (ref 5–15)
BUN: 10 mg/dL (ref 6–20)
CALCIUM: 9.5 mg/dL (ref 8.9–10.3)
CO2: 22 mmol/L (ref 22–32)
Chloride: 100 mmol/L — ABNORMAL LOW (ref 101–111)
Creatinine, Ser: 0.61 mg/dL (ref 0.44–1.00)
Glucose, Bld: 299 mg/dL — ABNORMAL HIGH (ref 65–99)
Potassium: 4.1 mmol/L (ref 3.5–5.1)
Sodium: 136 mmol/L (ref 135–145)

## 2017-04-27 LAB — GLUCOSE, CAPILLARY
Glucose-Capillary: 308 mg/dL — ABNORMAL HIGH (ref 65–99)
Glucose-Capillary: 356 mg/dL — ABNORMAL HIGH (ref 65–99)
Glucose-Capillary: 315 mg/dL — ABNORMAL HIGH (ref 65–99)

## 2017-04-27 LAB — POCT PREGNANCY, URINE: Preg Test, Ur: NEGATIVE

## 2017-04-27 SURGERY — DENTAL RESTORATION/EXTRACTIONS
Anesthesia: General

## 2017-04-27 MED ORDER — AMOXICILLIN 500 MG PO CAPS: 500.0000 mg | ORAL_CAPSULE | Freq: Three times a day (TID) | ORAL | 0 refills | Status: DC

## 2017-04-27 MED ORDER — LIDOCAINE HCL (CARDIAC) 20 MG/ML IV SOLN
INTRAVENOUS | Status: DC | PRN
Start: 2017-04-27 — End: 2017-04-27
  Administered 2017-04-27: 100 mg via INTRATRACHEAL

## 2017-04-27 MED ORDER — MIDAZOLAM HCL 2 MG/2ML IJ SOLN
INTRAMUSCULAR | Status: AC
  Filled 2017-04-27: qty 2

## 2017-04-27 MED ORDER — HYDROMORPHONE HCL 1 MG/ML IJ SOLN
INTRAMUSCULAR | Status: AC
  Filled 2017-04-27: qty 1

## 2017-04-27 MED ORDER — PROPOFOL 10 MG/ML IV BOLUS
INTRAVENOUS | Status: AC
  Filled 2017-04-27: qty 20

## 2017-04-27 MED ORDER — INSULIN ASPART 100 UNIT/ML ~~LOC~~ SOLN
10.0000 [IU] | Freq: Once | SUBCUTANEOUS | Status: AC
  Administered 2017-04-27: 10 [IU] via SUBCUTANEOUS

## 2017-04-27 MED ORDER — ACETAMINOPHEN 10 MG/ML IV SOLN
1000.0000 mg | Freq: Once | INTRAVENOUS | Status: DC | PRN
  Administered 2017-04-27: 1000 mg via INTRAVENOUS

## 2017-04-27 MED ORDER — FENTANYL CITRATE (PF) 250 MCG/5ML IJ SOLN
INTRAMUSCULAR | Status: DC | PRN
  Administered 2017-04-27: 50 ug via INTRAVENOUS
  Administered 2017-04-27: 25 ug via INTRAVENOUS
  Administered 2017-04-27: 125 ug via INTRAVENOUS
  Administered 2017-04-27 (×2): 25 ug via INTRAVENOUS

## 2017-04-27 MED ORDER — ONDANSETRON HCL 4 MG/2ML IJ SOLN
INTRAMUSCULAR | Status: DC | PRN
  Administered 2017-04-27: 4 mg via INTRAVENOUS

## 2017-04-27 MED ORDER — LIDOCAINE 2% (20 MG/ML) 5 ML SYRINGE
INTRAMUSCULAR | Status: AC
  Filled 2017-04-27: qty 5

## 2017-04-27 MED ORDER — METOPROLOL TARTRATE 5 MG/5ML IV SOLN
INTRAVENOUS | Status: AC
  Filled 2017-04-27: qty 5

## 2017-04-27 MED ORDER — OXYMETAZOLINE HCL 0.05 % NA SOLN
NASAL | Status: DC | PRN
  Administered 2017-04-27: 1

## 2017-04-27 MED ORDER — DEXAMETHASONE SODIUM PHOSPHATE 10 MG/ML IJ SOLN
INTRAMUSCULAR | Status: AC
  Filled 2017-04-27: qty 1

## 2017-04-27 MED ORDER — PHENYLEPHRINE 40 MCG/ML (10ML) SYRINGE FOR IV PUSH (FOR BLOOD PRESSURE SUPPORT)
PREFILLED_SYRINGE | INTRAVENOUS | Status: AC
  Filled 2017-04-27: qty 10

## 2017-04-27 MED ORDER — ACETAMINOPHEN 10 MG/ML IV SOLN: 1000.0000 mg | Freq: Four times a day (QID) | INTRAVENOUS | Status: DC

## 2017-04-27 MED ORDER — HYDROMORPHONE HCL 1 MG/ML IJ SOLN: 0.2500 mg | INTRAMUSCULAR | Status: DC | PRN

## 2017-04-27 MED ORDER — LIDOCAINE-EPINEPHRINE 2 %-1:100000 IJ SOLN
INTRAMUSCULAR | Status: DC | PRN
Start: 2017-04-27 — End: 2017-04-27
  Administered 2017-04-27: 20 mL via INTRADERMAL

## 2017-04-27 MED ORDER — ONDANSETRON HCL 4 MG/2ML IJ SOLN
INTRAMUSCULAR | Status: AC
  Filled 2017-04-27: qty 2

## 2017-04-27 MED ORDER — DEXAMETHASONE SODIUM PHOSPHATE 10 MG/ML IJ SOLN
INTRAMUSCULAR | Status: DC | PRN
  Administered 2017-04-27: 10 mg via INTRAVENOUS

## 2017-04-27 MED ORDER — INSULIN ASPART 100 UNIT/ML ~~LOC~~ SOLN
8.0000 [IU] | Freq: Once | SUBCUTANEOUS | Status: AC
  Administered 2017-04-27: 8 [IU] via SUBCUTANEOUS

## 2017-04-27 MED ORDER — OXYMETAZOLINE HCL 0.05 % NA SOLN
NASAL | Status: DC | PRN
  Administered 2017-04-27: 1 via NASAL
  Administered 2017-04-27: 2 via NASAL

## 2017-04-27 MED ORDER — PROPOFOL 10 MG/ML IV BOLUS
INTRAVENOUS | Status: DC | PRN
  Administered 2017-04-27: 200 mg via INTRAVENOUS
  Administered 2017-04-27: 50 mg via INTRAVENOUS
  Administered 2017-04-27: 150 mg via INTRAVENOUS

## 2017-04-27 MED ORDER — SUCCINYLCHOLINE CHLORIDE 20 MG/ML IJ SOLN
INTRAMUSCULAR | Status: DC | PRN
  Administered 2017-04-27: 80 mg via INTRAVENOUS
  Administered 2017-04-27: 120 mg via INTRAVENOUS

## 2017-04-27 MED ORDER — SUGAMMADEX SODIUM 200 MG/2ML IV SOLN
INTRAVENOUS | Status: AC
  Filled 2017-04-27: qty 2

## 2017-04-27 MED ORDER — SUCCINYLCHOLINE CHLORIDE 200 MG/10ML IV SOSY
PREFILLED_SYRINGE | INTRAVENOUS | Status: AC
  Filled 2017-04-27: qty 10

## 2017-04-27 MED ORDER — LACTATED RINGERS IV SOLN
INTRAVENOUS | Status: DC | PRN
  Administered 2017-04-27 (×2): via INTRAVENOUS

## 2017-04-27 MED ORDER — FENTANYL CITRATE (PF) 250 MCG/5ML IJ SOLN
INTRAMUSCULAR | Status: AC
  Filled 2017-04-27: qty 5

## 2017-04-27 MED ORDER — PHENYLEPHRINE HCL 10 MG/ML IJ SOLN
INTRAMUSCULAR | Status: DC | PRN
  Administered 2017-04-27: 80 ug via INTRAVENOUS
  Administered 2017-04-27: 120 ug via INTRAVENOUS

## 2017-04-27 MED ORDER — HYDROCODONE-ACETAMINOPHEN 7.5-325 MG PO TABS: 1.0000 | ORAL_TABLET | Freq: Once | ORAL | Status: DC | PRN

## 2017-04-27 MED ORDER — METOPROLOL TARTARATE 1 MG/ML SYRINGE (5ML)
Status: DC | PRN
  Administered 2017-04-27 (×2): 2 mg via INTRAVENOUS
  Administered 2017-04-27: 1 mg via INTRAVENOUS

## 2017-04-27 MED ORDER — HYDROCODONE-ACETAMINOPHEN 5-325 MG PO TABS: 1.0000 | ORAL_TABLET | Freq: Four times a day (QID) | ORAL | 0 refills | Status: DC | PRN

## 2017-04-27 MED ORDER — PROMETHAZINE HCL 25 MG/ML IJ SOLN: 6.2500 mg | INTRAMUSCULAR | Status: DC | PRN

## 2017-04-27 MED ORDER — GLYCOPYRROLATE 0.2 MG/ML IJ SOLN
INTRAMUSCULAR | Status: DC | PRN
  Administered 2017-04-27: 0.2 mg via INTRAVENOUS

## 2017-04-27 MED ORDER — MEPERIDINE HCL 50 MG/ML IJ SOLN: 6.2500 mg | INTRAMUSCULAR | Status: DC | PRN

## 2017-04-27 MED ORDER — LIDOCAINE-EPINEPHRINE 2 %-1:100000 IJ SOLN
INTRAMUSCULAR | Status: AC
  Filled 2017-04-27: qty 1

## 2017-04-27 MED ORDER — ACETAMINOPHEN 10 MG/ML IV SOLN
INTRAVENOUS | Status: AC
  Filled 2017-04-27: qty 100

## 2017-04-27 MED ORDER — INSULIN ASPART 100 UNIT/ML ~~LOC~~ SOLN
SUBCUTANEOUS | Status: AC
  Filled 2017-04-27: qty 1

## 2017-04-27 MED ORDER — OXYMETAZOLINE HCL 0.05 % NA SOLN
NASAL | Status: AC
  Filled 2017-04-27: qty 15

## 2017-04-27 MED ORDER — SODIUM CHLORIDE 0.9 % IR SOLN
Status: DC | PRN
  Administered 2017-04-27: 1000 mL

## 2017-04-27 SURGICAL SUPPLY — 34 items
BLADE SURG 15 STRL LF DISP TIS (BLADE) IMPLANT
BLADE SURG 15 STRL SS (BLADE)
BUR CROSS CUT FISSURE 1.6 (BURR) ×2 IMPLANT
BUR CROSS CUT FISSURE 1.6MM (BURR) ×1
BUR EGG ELITE 4.0 (BURR) IMPLANT
BUR EGG ELITE 4.0MM (BURR)
CANISTER SUCT 3000ML PPV (MISCELLANEOUS) ×3 IMPLANT
COVER SURGICAL LIGHT HANDLE (MISCELLANEOUS) ×3 IMPLANT
DECANTER SPIKE VIAL GLASS SM (MISCELLANEOUS) ×3 IMPLANT
GAUZE PACKING FOLDED 2  STR (GAUZE/BANDAGES/DRESSINGS) ×2
GAUZE PACKING FOLDED 2 STR (GAUZE/BANDAGES/DRESSINGS) ×1 IMPLANT
GLOVE BIO SURGEON STRL SZ 6.5 (GLOVE) ×2 IMPLANT
GLOVE BIO SURGEON STRL SZ7 (GLOVE) IMPLANT
GLOVE BIO SURGEON STRL SZ7.5 (GLOVE) ×3 IMPLANT
GLOVE BIO SURGEONS STRL SZ 6.5 (GLOVE) ×1
GLOVE BIOGEL PI IND STRL 6.5 (GLOVE) IMPLANT
GLOVE BIOGEL PI IND STRL 7.0 (GLOVE) IMPLANT
GLOVE BIOGEL PI INDICATOR 6.5 (GLOVE)
GLOVE BIOGEL PI INDICATOR 7.0 (GLOVE)
GOWN STRL REUS W/ TWL LRG LVL3 (GOWN DISPOSABLE) ×1 IMPLANT
GOWN STRL REUS W/ TWL XL LVL3 (GOWN DISPOSABLE) ×1 IMPLANT
GOWN STRL REUS W/TWL LRG LVL3 (GOWN DISPOSABLE) ×2
GOWN STRL REUS W/TWL XL LVL3 (GOWN DISPOSABLE) ×2
KIT BASIN OR (CUSTOM PROCEDURE TRAY) ×3 IMPLANT
KIT ROOM TURNOVER OR (KITS) ×3 IMPLANT
NEEDLE 22X1 1/2 (OR ONLY) (NEEDLE) ×6 IMPLANT
NS IRRIG 1000ML POUR BTL (IV SOLUTION) ×3 IMPLANT
PAD ARMBOARD 7.5X6 YLW CONV (MISCELLANEOUS) ×3 IMPLANT
SPONGE SURGIFOAM ABS GEL 12-7 (HEMOSTASIS) IMPLANT
SUT CHROMIC 3 0 PS 2 (SUTURE) ×6 IMPLANT
SYR CONTROL 10ML LL (SYRINGE) ×3 IMPLANT
TRAY ENT MC OR (CUSTOM PROCEDURE TRAY) ×3 IMPLANT
TUBING IRRIGATION (MISCELLANEOUS) ×3 IMPLANT
YANKAUER SUCT BULB TIP NO VENT (SUCTIONS) ×3 IMPLANT

## 2017-04-27 NOTE — H&P (Signed)
H&P documentation  -History and Physical Reviewed  -Patient has been re-examined  -No change in the plan of care  Carolyn Ingram  

## 2017-04-27 NOTE — Transfer of Care (Signed)
Immediate Anesthesia Transfer of Care Note  Patient: Carolyn Ingram  Procedure(s) Performed: DENTAL RESTORATION/EXTRACTIONS (N/A )  Patient Location: PACU  Anesthesia Type:General  Level of Consciousness: awake, alert , oriented and patient cooperative  Airway & Oxygen Therapy: Patient Spontanous Breathing and Patient connected to face mask oxygen  Post-op Assessment: Report given to RN, Post -op Vital signs reviewed and stable, Patient moving all extremities X 4 and Patient able to stick tongue midline  Post vital signs: Reviewed and stable  Last Vitals:  Vitals:   04/27/17 0619 04/27/17 0845  BP: 134/75   Pulse: 85   Resp: 16   Temp: 36.8 C 36.6 C  SpO2: 98%     Last Pain:  Vitals:   04/27/17 0619  TempSrc: Oral      Patients Stated Pain Goal: 3 (04/27/17 [id number])  Complications: No apparent anesthesia complications

## 2017-04-27 NOTE — Anesthesia Procedure Notes (Signed)
Procedure Name: Intubation Date/Time: 04/27/2017 7:45 AM Performed by: Barnet Glasgow, MD Pre-anesthesia Checklist: Patient identified, Emergency Drugs available, Suction available and Patient being monitored Patient Re-evaluated:Patient Re-evaluated prior to induction Oxygen Delivery Method: Circle system utilized Preoxygenation: Pre-oxygenation with 100% oxygen Induction Type: IV induction Ventilation: Nasal airway inserted- appropriate to patient size and Mask ventilation with difficulty Laryngoscope Size: Mac and 3 Grade View: Grade II Tube type: Oral Nasal Tubes: Left Tube size: 6.5 mm Number of attempts: 3 (Nasal tube would not pass though cords, DL x3 with oral tube placed) Airway Equipment and Method: Stylet Placement Confirmation: ETT inserted through vocal cords under direct vision,  positive ETCO2 and breath sounds checked- equal and bilateral Secured at: 23 cm Dental Injury: Teeth and Oropharynx as per pre-operative assessment  Difficulty Due To: Difficulty was anticipated

## 2017-04-27 NOTE — Anesthesia Postprocedure Evaluation (Signed)
Anesthesia Post Note  Patient: Carolyn Ingram  Procedure(s) Performed: DENTAL RESTORATION/EXTRACTIONS (N/A )     Patient location during evaluation: PACU Anesthesia Type: General Level of consciousness: awake and alert Pain management: pain level controlled Vital Signs Assessment: post-procedure vital signs reviewed and stable Respiratory status: spontaneous breathing, nonlabored ventilation, respiratory function stable and patient connected to nasal cannula oxygen Cardiovascular status: blood pressure returned to baseline and stable Postop Assessment: no apparent nausea or vomiting Anesthetic complications: no    Last Vitals:  Vitals:   04/27/17 0955 04/27/17 1000  BP: (!) 149/89   Pulse: (!) 111 (!) 110  Resp:    Temp:    SpO2: 93% 94%    Last Pain:  Vitals:   04/27/17 0917  TempSrc:   PainSc: 8                  Trevor IhaStephen A Rocklyn Mayberry

## 2017-04-27 NOTE — Op Note (Signed)
04/27/2017  8:29 AM  PATIENT:  Carolyn Ingram  22 y.o. female  PRE-OPERATIVE DIAGNOSIS:  NON RESTORABLE TEETH # 1, 16, 17, 31, 32, MORBID OBESITY  POST-OPERATIVE DIAGNOSIS:  SAME  PROCEDURE:  Procedure(s): DENTAL EXTRACTIONS TEETH # 1, 16, 17, 31, 32  SURGEON:  Surgeon(s): Barbette MerinoJensen, Acupuncturistcott, DDS  ANESTHESIA:   local and general  EBL:  minimal  DRAINS: none   SPECIMEN:  No Specimen  COUNTS:  YES  PLAN OF CARE: Discharge to home after PACU  PATIENT DISPOSITION:  PACU - hemodynamically stable.   PROCEDURE DETAILS: Dictation # [id number]  Georgia LopesScott M. Ruthy Forry, DMD 04/27/2017 8:29 AM

## 2017-04-27 NOTE — Progress Notes (Signed)
Dr Richardson LandryHOuser aware of blood sugar , ok with pt  Being discharged

## 2017-04-28 ENCOUNTER — Encounter (HOSPITAL_COMMUNITY): Payer: Self-pay | Admitting: Oral Surgery

## 2017-04-30 NOTE — Op Note (Signed)
Carolyn Ingram, Carolyn Ingram NO.:  [id number]  MEDICAL RECORD NO.:  [id number]  LOCATION:                                 FACILITY:  PHYSICIAN:  Georgia Lopes, M.D.  DATE OF BIRTH:  [birth year]  DATE OF PROCEDURE:  04/27/2017 DATE OF DISCHARGE:  04/27/2017                              OPERATIVE REPORT   PREOPERATIVE DIAGNOSES: 1. Nonrestorable teeth numbers 1, 16, 17, 31, 32. 2. Morbid obesity.  POSTOPERATIVE DIAGNOSES: 1. Nonrestorable teeth numbers 1, 16, 17, 31, 32. 2. Morbid obesity.  PROCEDURE:  Extraction of teeth numbers 1, 16, 17, 31, 32.  INDICATIONS FOR PROCEDURE:  Carolyn Ingram is a 22 year old female with a BMI of 52, history of diabetes (uncontrolled), and hypertension, who presented to the office with the request of her general dentist for removal of multiple teeth secondary to dental caries.  Because of the patient's BMI and the medical risks associated with the surgery, it was recommended that the procedure be done at [facility], so general anesthesia and intubation could be used.  DESCRIPTION OF PROCEDURE:  The patient was taken to the operating room and placed on the table in supine position.  General anesthesia was administered intravenously.  After multiple attempts, the nasal and oral endotracheal tube was placed and secured.  The eyes were protected and the patient was draped for surgery.  Time-out was performed.  The posterior pharynx was suctioned.  A throat pack was placed.  A 2% lidocaine with 1:100,000 epinephrine was infiltrated in an inferior alveolar block on the right and left side and buccal and palatal infiltration in the posterior maxilla.  A total of 20 mL was utilized. A bite block was placed in the left side of the mouth and a 15 blade was used to make an incision around teeth numbers 31 and 32 in the gingival sulcus and around tooth #1.  The periosteum was reflected.  The teeth were elevated with a 301 elevator.  Tooth #32 required  removal of bone as to tooth #31 with a Stryker handpiece under irrigation.  Root tips were removed with the root tip pick.  Then, the teeth were removed. Tooth #1 was removed using an upper universal forceps and a 301 elevator.  The sockets were then curetted and irrigated and closed with 3-0 chromic.  Then, the throat pack was removed.  The endotracheal tube was repositioned and secured to the other side of the mouth and the throat pack was replaced.  Then, the bite block and sweetheart retractor were placed on the right side of the mouth for access to the left maxilla and mandible.  A 15 blade was used to make an incision around teeth numbers 16 and 17.  The periosteum was reflected.  The teeth were elevated with a 301 elevator.  Forceps removal was attempted with tooth #17, however, the tooth fractured requiring removal of bone and removal of root tips using the root tip pick.  The tooth #16 was removed with a 301 elevator and then the upper #150 forceps.  The sockets were curetted, irrigated and closed with 3-0 chromic.  The oral cavity was then irrigated and suctioned.  The throat  pack was removed.  The patient was left in care of Anesthesia for extubation, awakening, transferred to the recovery room with plans to discharge home if stable.  ESTIMATED BLOOD LOSS:  Minimum.  COMPLICATIONS:  None.  SPECIMENS:  None.     Georgia LopesScott M. Bianey Tesoro, M.D.     SMJ/MEDQ  D:  04/27/2017  T:  04/27/2017  Job:  [id number]

## 2018-05-23 ENCOUNTER — Ambulatory Visit (INDEPENDENT_AMBULATORY_CARE_PROVIDER_SITE_OTHER): Payer: Medicare Other | Admitting: Obstetrics

## 2018-05-23 ENCOUNTER — Encounter: Payer: Self-pay | Admitting: Obstetrics

## 2018-05-23 ENCOUNTER — Other Ambulatory Visit: Payer: Self-pay

## 2018-05-23 DIAGNOSIS — E139 Other specified diabetes mellitus without complications: Secondary | ICD-10-CM

## 2018-05-23 DIAGNOSIS — E66813 Obesity, class 3: Secondary | ICD-10-CM

## 2018-05-23 DIAGNOSIS — I10 Essential (primary) hypertension: Secondary | ICD-10-CM | POA: Diagnosis not present

## 2018-05-23 DIAGNOSIS — Z6841 Body Mass Index (BMI) 40.0 and over, adult: Secondary | ICD-10-CM | POA: Diagnosis not present

## 2018-05-23 DIAGNOSIS — F319 Bipolar disorder, unspecified: Secondary | ICD-10-CM

## 2018-05-23 DIAGNOSIS — N939 Abnormal uterine and vaginal bleeding, unspecified: Secondary | ICD-10-CM | POA: Diagnosis not present

## 2018-05-23 NOTE — Progress Notes (Signed)
TELEHEALTH VIRTUAL GYNECOLOGY VISIT ENCOUNTER NOTE  I connected with Carolyn Ingram on 05/23/18 at  3:15 PM EDT by telephone at home and verified that I am speaking with the correct person using two identifiers.   I discussed the limitations, risks, security and privacy concerns of performing an evaluation and management service by telephone and the availability of in person appointments. I also discussed with the patient that there may be a patient responsible charge related to this service. The patient expressed understanding and agreed to proceed.   History:  Carolyn Ingram is a 23 y.o. G0P0 female being evaluated today for heavy and painful menstrual cycles.  Not on any hormonal regulation of cycles.  The patient has multiple medical problems and is disabled.  She was a patient in this practice many years ago.  Her mother now wants to re-establish care.  She requests a female provider.     Past Medical History:  Diagnosis Date  . Anemia   . Bipolar disorder (HCC)   . Dental caries   . Diabetes mellitus without complication (HCC)   . Headache   . HNP (herniated nucleus pulposus)   . Hypertension   . Impacted teeth    wisdom  . Menorrhagia   . Morbid obesity with BMI of 50.0-59.9, adult (HCC)   . Numbness and tingling of leg    right  . OCD (obsessive compulsive disorder)   . Shortness of breath dyspnea   . Tourette's disorder   . Wears glasses    Past Surgical History:  Procedure Laterality Date  . LUMBAR LAMINECTOMY/DECOMPRESSION MICRODISCECTOMY Right 02/08/2015   Procedure: Microdiscectomy - right - L5-S1 ;  Surgeon: Julio Sicks, MD;  Location: [facility];  Service: Neurosurgery;  Laterality: Right;  Microdiscectomy - right - L5-S1   . TOOTH EXTRACTION N/A 04/27/2017   Procedure: DENTAL RESTORATION/EXTRACTIONS;  Surgeon: Ocie Doyne, DDS;  Location: [facility] OR;  Service: Oral Surgery;  Laterality: N/A;   The following portions of the patient's history were reviewed and  updated as appropriate: allergies, current medications, past family history, past medical history, past social history, past surgical history and problem list.   Health Maintenance:  Normal pap and negative HRHPV on unknown date.    Review of Systems:  Pertinent items noted in HPI and remainder of comprehensive ROS otherwise negative.  Physical Exam:  Physical exam deferred due to nature of the encounter  Labs and Imaging No results found for this or any previous visit (from the past 336 hour(s)). No results found.     Assessment and Plan:      1. Abnormal uterine bleeding (AUB) - O  2. Class 3 severe obesity due to excess calories with serious comorbidity and body mass index (BMI) of 50.0 to 59.9 in adult (HCC)  3. HTN (hypertension), benign - managed by PCP  4. Diabetes 1.5, managed as type 2 (HCC) - same as abobe  5. Bipolar affective disorder, remission status unspecified (HCC) - same as above      I discussed the assessment and treatment plan with the patient. The patient was provided an opportunity to ask questions and all were answered. The patient agreed with the plan and demonstrated an understanding of the instructions.   The patient was advised to call back or seek an in-person evaluation/go to the ED if the symptoms worsen or if the condition fails to improve as anticipated.  I provided 10 minutes of non-face-to-face time during this encounter.  Coral Ceo, MD [facility], [facility] 05-23-2018

## 2018-05-23 NOTE — Progress Notes (Signed)
Pt has had heavy bleeding since December with clotting.  Pt has been anemic in past, dizziness. Pt mother states she has to wear pull ups along with pads.  Pt is having severe pain/cramping with cycles.

## 2018-05-30 ENCOUNTER — Ambulatory Visit: Payer: Medicare Other | Admitting: Obstetrics and Gynecology

## 2018-06-10 ENCOUNTER — Ambulatory Visit: Payer: Medicare Other | Admitting: Obstetrics and Gynecology

## 2018-06-12 ENCOUNTER — Ambulatory Visit: Payer: Medicare Other | Admitting: Obstetrics and Gynecology

## 2018-07-18 ENCOUNTER — Ambulatory Visit: Payer: Medicare Other | Admitting: Obstetrics

## 2018-09-10 ENCOUNTER — Ambulatory Visit: Payer: Medicare Other | Admitting: Obstetrics

## 2018-09-11 ENCOUNTER — Encounter

## 2020-08-01 DIAGNOSIS — E782 Mixed hyperlipidemia: Secondary | ICD-10-CM | POA: Insufficient documentation

## 2020-08-01 DIAGNOSIS — F633 Trichotillomania: Secondary | ICD-10-CM | POA: Insufficient documentation

## 2020-08-01 DIAGNOSIS — R7309 Other abnormal glucose: Secondary | ICD-10-CM | POA: Insufficient documentation

## 2020-08-04 ENCOUNTER — Other Ambulatory Visit: Payer: Self-pay | Admitting: Nurse Practitioner

## 2020-08-04 DIAGNOSIS — Z13820 Encounter for screening for osteoporosis: Secondary | ICD-10-CM

## 2020-08-05 ENCOUNTER — Other Ambulatory Visit: Payer: Self-pay | Admitting: Nurse Practitioner

## 2020-08-05 DIAGNOSIS — Z1231 Encounter for screening mammogram for malignant neoplasm of breast: Secondary | ICD-10-CM

## 2020-08-09 ENCOUNTER — Other Ambulatory Visit: Payer: Self-pay | Admitting: Nurse Practitioner

## 2020-08-09 DIAGNOSIS — M810 Age-related osteoporosis without current pathological fracture: Secondary | ICD-10-CM

## 2020-09-13 DIAGNOSIS — E119 Type 2 diabetes mellitus without complications: Secondary | ICD-10-CM | POA: Insufficient documentation

## 2021-07-05 DIAGNOSIS — F209 Schizophrenia, unspecified: Secondary | ICD-10-CM | POA: Insufficient documentation

## 2021-09-27 DIAGNOSIS — F331 Major depressive disorder, recurrent, moderate: Secondary | ICD-10-CM | POA: Insufficient documentation

## 2022-06-07 DIAGNOSIS — R7309 Other abnormal glucose: Secondary | ICD-10-CM | POA: Insufficient documentation

## 2023-03-03 ENCOUNTER — Ambulatory Visit
Admission: EM | Admit: 2023-03-03 | Discharge: 2023-03-03 | Disposition: A | Discharge status: Home / Self Care | Payer: 59

## 2023-03-03 DIAGNOSIS — R200 Anesthesia of skin: Secondary | ICD-10-CM

## 2023-03-03 NOTE — ED Triage Notes (Signed)
"  This started with my wrist's hurting and more left". "Also my right leg goes numb with standing and walking at times". No injury known. "And last night I was feeling left sided head pressure".

## 2023-03-03 NOTE — ED Notes (Signed)
 Patient is being discharged from the Urgent Care and sent to the Emergency Department via Private Vehicle (Self) . Per Provider, patient is in need of higher level of care due to Complexity of Care. Patient is aware and verbalizes understanding of plan of care.

## 2023-03-03 NOTE — ED Provider Notes (Signed)
 Patient presents to urgent care for evaluation of numbness to her right leg.  She reports this has been coming and going over the last few days with walking.  She also notes that yesterday she started to have some left-sided pressure in the back of her head.  She notes symptoms all began when she had bilateral wrist pain.  Given multiple peripheral issues with now head pressure that she be seen in the emergency room for stat imaging.  Patient is agreeable to same.   Billy Asberry FALCON, PA-C 03/03/23 [phone]

## 2023-03-04 ENCOUNTER — Emergency Department (HOSPITAL_BASED_OUTPATIENT_CLINIC_OR_DEPARTMENT_OTHER)
Admission: EM | Admit: 2023-03-04 | Discharge: 2023-03-04 | Disposition: A | Discharge status: Home / Self Care | Payer: 59 | Attending: Emergency Medicine | Admitting: Emergency Medicine

## 2023-03-04 ENCOUNTER — Emergency Department (HOSPITAL_BASED_OUTPATIENT_CLINIC_OR_DEPARTMENT_OTHER): Payer: 59

## 2023-03-04 ENCOUNTER — Other Ambulatory Visit: Payer: Self-pay

## 2023-03-04 ENCOUNTER — Encounter (HOSPITAL_BASED_OUTPATIENT_CLINIC_OR_DEPARTMENT_OTHER): Payer: Self-pay | Admitting: Emergency Medicine

## 2023-03-04 DIAGNOSIS — R200 Anesthesia of skin: Secondary | ICD-10-CM | POA: Diagnosis not present

## 2023-03-04 DIAGNOSIS — M545 Low back pain, unspecified: Secondary | ICD-10-CM | POA: Insufficient documentation

## 2023-03-04 DIAGNOSIS — G5603 Carpal tunnel syndrome, bilateral upper limbs: Secondary | ICD-10-CM | POA: Insufficient documentation

## 2023-03-04 DIAGNOSIS — Z79899 Other long term (current) drug therapy: Secondary | ICD-10-CM | POA: Insufficient documentation

## 2023-03-04 DIAGNOSIS — E1165 Type 2 diabetes mellitus with hyperglycemia: Secondary | ICD-10-CM | POA: Diagnosis not present

## 2023-03-04 DIAGNOSIS — Z7984 Long term (current) use of oral hypoglycemic drugs: Secondary | ICD-10-CM | POA: Diagnosis not present

## 2023-03-04 DIAGNOSIS — R519 Headache, unspecified: Secondary | ICD-10-CM | POA: Insufficient documentation

## 2023-03-04 DIAGNOSIS — I10 Essential (primary) hypertension: Secondary | ICD-10-CM | POA: Diagnosis not present

## 2023-03-04 DIAGNOSIS — Z794 Long term (current) use of insulin: Secondary | ICD-10-CM | POA: Insufficient documentation

## 2023-03-04 DIAGNOSIS — M25531 Pain in right wrist: Secondary | ICD-10-CM

## 2023-03-04 LAB — CBC WITH DIFFERENTIAL/PLATELET
Abs Immature Granulocytes: 0.03 10*3/uL (ref 0.00–0.07)
Basophils Absolute: 0 10*3/uL (ref 0.0–0.1)
Basophils Relative: 0 %
Eosinophils Absolute: 0.1 10*3/uL (ref 0.0–0.5)
Eosinophils Relative: 1 %
HCT: 40.4 % (ref 36.0–46.0)
Hemoglobin: 13.2 g/dL (ref 12.0–15.0)
Immature Granulocytes: 0 %
Lymphocytes Relative: 32 %
Lymphs Abs: 3.3 10*3/uL (ref 0.7–4.0)
MCH: 28.6 pg (ref 26.0–34.0)
MCHC: 32.7 g/dL (ref 30.0–36.0)
MCV: 87.4 fL (ref 80.0–100.0)
Monocytes Absolute: 0.5 10*3/uL (ref 0.1–1.0)
Monocytes Relative: 5 %
Neutro Abs: 6.4 10*3/uL (ref 1.7–7.7)
Neutrophils Relative %: 62 %
Platelets: 351 10*3/uL (ref 150–400)
RBC: 4.62 MIL/uL (ref 3.87–5.11)
RDW: 12.6 % (ref 11.5–15.5)
WBC: 10.3 10*3/uL (ref 4.0–10.5)
nRBC: 0 % (ref 0.0–0.2)

## 2023-03-04 LAB — COMPREHENSIVE METABOLIC PANEL
ALT: 13 U/L (ref 0–44)
AST: 12 U/L — ABNORMAL LOW (ref 15–41)
Albumin: 4.3 g/dL (ref 3.5–5.0)
Alkaline Phosphatase: 70 U/L (ref 38–126)
Anion gap: 8 (ref 5–15)
BUN: 13 mg/dL (ref 6–20)
CO2: 29 mmol/L (ref 22–32)
Calcium: 9.4 mg/dL (ref 8.9–10.3)
Chloride: 99 mmol/L (ref 98–111)
Creatinine, Ser: 0.54 mg/dL (ref 0.44–1.00)
GFR, Estimated: >60 mL/min (ref >60)
Glucose, Bld: 210 mg/dL — ABNORMAL HIGH (ref 70–99)
Potassium: 4 mmol/L (ref 3.5–5.1)
Sodium: 136 mmol/L (ref 135–145)
Total Bilirubin: 0.5 mg/dL (ref 0.0–1.2)
Total Protein: 8.4 g/dL — ABNORMAL HIGH (ref 6.5–8.1)

## 2023-03-04 LAB — HCG, SERUM, QUALITATIVE: Preg, Serum: NEGATIVE

## 2023-03-04 MED ORDER — DEXAMETHASONE 4 MG PO TABS
10.0000 mg | ORAL_TABLET | Freq: Once | ORAL | Status: AC
  Administered 2023-03-04: 10 mg via ORAL
  Filled 2023-03-04: qty 3

## 2023-03-04 MED ORDER — CYCLOBENZAPRINE HCL 10 MG PO TABS: 10.0000 mg | ORAL_TABLET | Freq: Two times a day (BID) | ORAL | 0 refills | Status: DC | PRN

## 2023-03-04 NOTE — ED Provider Notes (Signed)
 [place] AT [name] Provider Note   CSN: [id number] Arrival date & time: 03/04/23  9268     History  Chief Complaint  Patient presents with   Headache   Leg Pain    Carolyn Ingram is a 28 y.o. female.  HPI     27yo female with history of DM, hypertension, bipolar disorder, OCD, who presents with concern for right leg numbness, headache and bilateral wrist pain.  Reports that she has a headache, more described as a pressure at the top of her head for the past 2 weeks.  Denies any nausea or vomiting.  She also notes bilateral wrist pain that has also been waxing and waning, worse when she moves her wrist.  Has some tingling of her thumb and 3 fingers on the left side.  No weakness.  No neck pain.  No fever history of IV drug use.  Has numbness to the lateral side of the right leg which goes down.  Has a history of sciatica and lumbar surgery in the past with Dr. Malcolm.  Reports that she began having increased back pain that worsens when she sits down, and numbness that she notes on the lateral side of her right leg as she is walking around.  She has not taken anything for back pain.  Denies any falls or trauma, denies history of IV drug use or fever.  The pain is 9 out of 10 when she bends over.  No abdominal pain, urinary symptoms, loss of control of bowel or bladder.  Denies vision changes, speech changes, difficulty walking, weakness, facial droop.  Has lightheadedness when she goes from sitting to standing.  She went to urgent care yesterday and was encouraged to come to the emergency department. Past Medical History:  Diagnosis Date   Anemia    Bipolar disorder (HCC)    Dental caries    Diabetes mellitus without complication (HCC)    Headache    HNP (herniated nucleus pulposus)    Hypertension    Impacted teeth    wisdom   Menorrhagia    Morbid obesity with BMI of 50.0-59.9, adult (HCC)    Numbness and tingling of leg    right    OCD (obsessive compulsive disorder)    Shortness of breath dyspnea    Tourette's disorder    Wears glasses     Home Medications Prior to Admission medications   Medication Sig Start Date End Date Taking? Authorizing Provider  cyclobenzaprine  (FLEXERIL ) 10 MG tablet Take 1 tablet (10 mg total) by mouth 2 (two) times daily as needed for muscle spasms. 03/04/23  Yes Dreama Longs, MD  amlodipine-atorvastatin (CADUET) 10-10 MG tablet Take 1 tablet by mouth daily. Last dose: Thursday.    [provider]  amoxicillin  (AMOXIL ) 500 MG capsule Take 1 capsule (500 mg total) by mouth 3 (three) times daily. 04/27/17   Sheryle Hamilton, DMD  atorvastatin (LIPITOR) 40 MG tablet Take 1 tablet by mouth daily.    [provider]  Blood Glucose Monitoring Suppl (ACCU-CHEK GUIDE ME) w/Device KIT     [provider]  cariprazine (VRAYLAR) capsule Take by mouth.    [provider]  chlorhexidine  (PERIDEX) 0.12 % solution Use as directed 5 mLs in the mouth or throat 2 (two) times daily.    [provider]  docusate sodium (COLACE) 100 MG capsule Take 100 mg by mouth daily. 12/15/22   [provider]  FLUoxetine HCl 60 MG TABS  Take 60 mg by mouth daily. 12/15/22   [provider]  fluvoxaMINE (LUVOX) 50 MG tablet Take 50 mg by mouth daily. 03/15/17   [provider]  glipiZIDE (GLUCOTROL) 5 MG tablet Take 5 mg by mouth daily. 03/23/17   [provider]  hydrOXYzine (ATARAX/VISTARIL) 10 MG tablet Take 10 mg by mouth 3 (three) times daily as needed.    [provider]  ibuprofen (ADVIL) 800 MG tablet Take 1 tablet by mouth every 8 (eight) hours as needed.    [provider]  insulin  degludec (TRESIBA FLEXTOUCH) 200 UNIT/ML FlexTouch Pen Inject 40 Units into the skin daily at 6 (six) AM.    [provider]  insulin  detemir (LEVEMIR FLEXPEN) 100 UNIT/ML FlexPen Inject 40 Units into the skin daily. 12/15/22   [provider]  lisinopril -hydrochlorothiazide (PRINZIDE,ZESTORETIC) 10-12.5 MG tablet Take 1 tablet by mouth daily.    [provider]  Lurasidone HCl 60 MG TABS Take 1 tablet by mouth daily.    [provider]  metFORMIN (GLUCOPHAGE) 1000 MG tablet Take 1 tablet by mouth daily.    [provider]  olmesartan-hydrochlorothiazide (BENICAR HCT) 20-12.5 MG tablet Take 1 tablet by mouth daily. Last Thursday.    [provider]  pregabalin (LYRICA) 100 MG capsule Take 100 mg by mouth 2 (two) times daily.    [provider]  Semaglutide, 1 MG/DOSE, (OZEMPIC, 1 MG/DOSE,) 4 MG/3ML SOPN 1 MG SUBCUTANEOUSLY WEEKLY AS DIRECTED.    [provider]  tirzepatide [facility]) 7.5 MG/0.5ML Pen Inject 0.5 mLs into the skin once a week.    [provider]  traZODone (DESYREL) 50 MG tablet Take 50 mg by mouth at bedtime. 03/15/17   [provider]      Allergies    Losartan    Review of Systems   Review of Systems  Physical Exam Updated Vital Signs BP 131/78 (BP Location: Right Arm)   Pulse 97   Temp 98.1 F (36.7 C) (Oral)   Resp 18   Ht 5' 6 (1.676 m)   Wt (!) 152.4 kg   LMP 12/24/2022 (Approximate)   SpO2 100%   BMI 54.23 kg/m  Physical Exam Constitutional:      General: She is not in acute distress.    Appearance: Normal appearance. She is not ill-appearing.  HENT:     Head: Normocephalic and atraumatic.  Eyes:     General: No visual field deficit.    Extraocular Movements: Extraocular movements intact.     Conjunctiva/sclera: Conjunctivae normal.     Pupils: Pupils are equal, round, and reactive to light.  Cardiovascular:     Rate and Rhythm: Normal rate and regular rhythm.     Pulses: Normal pulses.  Pulmonary:     Effort: Pulmonary effort is normal. No respiratory distress.  Musculoskeletal:        General: No swelling or tenderness.     Cervical back: Normal range of motion.  Skin:    General: Skin is warm  and dry.     Findings: No erythema or rash.  Neurological:     General: No focal deficit present.     Mental Status: She is alert and oriented to person, place, and time.     GCS: GCS eye subscore is 4. GCS verbal subscore is 5. GCS motor subscore is 6.     Cranial Nerves: No cranial nerve deficit, dysarthria or facial asymmetry.     Sensory: Sensory deficit (right  lateral leg numbness, left thumb, index, middle, ring finger numbness) present.     Motor: No weakness or tremor.     Coordination: Coordination normal. Finger-Nose-Finger Test normal.     Gait: Gait normal.     ED Results / Procedures / Treatments   Labs (all labs ordered are listed, but only abnormal results are displayed) Labs Reviewed  COMPREHENSIVE METABOLIC PANEL - Abnormal; Notable for the following components:      Result Value   Glucose, Bld 210 (*)    Total Protein 8.4 (*)    AST 12 (*)    All other components within normal limits  CBC WITH DIFFERENTIAL/PLATELET  HCG, SERUM, QUALITATIVE    EKG EKG Interpretation Date/Time:  Sunday March 04 2023 08:11:29 EST Ventricular Rate:  94 PR Interval:  104 QRS Duration:  96 QT Interval:  376 QTC Calculation: 471 R Axis:   30  Text Interpretation: Sinus rhythm Short PR interval Low voltage, precordial leads Borderline T abnormalities, diffuse leads No significant change since last tracing Confirmed by Quenisha Lovins ([id number]) on 03/04/2023 9:30:16 AM  Radiology CT Head Wo Contrast Result Date: 03/04/2023 CLINICAL DATA:  Intermittent headache for 2 weeks EXAM: CT HEAD WITHOUT CONTRAST TECHNIQUE: Contiguous axial images were obtained from the base of the skull through the vertex without intravenous contrast. RADIATION DOSE REDUCTION: This exam was performed according to the departmental dose-optimization program which includes automated exposure control, adjustment of the mA and/or kV according to patient size and/or use of iterative reconstruction technique.  COMPARISON:  None Available. FINDINGS: Brain: No evidence of acute infarction, hemorrhage, hydrocephalus, extra-axial collection or mass lesion/mass effect. Vascular: No hyperdense vessel or unexpected calcification. Skull: Normal. Negative for fracture or focal lesion. Sinuses/Orbits: Symmetric thickening of the adenoids. The mastoids and sinuses are clear. IMPRESSION: Unremarkable appearance of the brain. Electronically Signed   By: Jonathan  Watts M.D.   On: 03/04/2023 08:20    Procedures Procedures    Medications Ordered in ED Medications  dexamethasone (DECADRON) tablet 10 mg (10 mg Oral Given 03/04/23 0947)    ED Course/ Medical Decision Making/ A&P                                   27 yo female with history of DM, hypertension, bipolar disorder, OCD, microdiscectomy in 2016 with Dr. Malcolm, who presents with concern for right leg numbness, headache and bilateral wrist pain.  Regarding wrist pain--do not see signs of fracture or septic arthritis.  Discussed given location as well as tingling on the radial side of the hand that symptoms may be secondary carpal tunnel recommend wrist splinting at night and PCP follow-up for further evaluation.  Does not have neck pain or symptoms to suggest a central cervical pathology.  Has normal pulses bilaterally, no sign of acute arterial thrombus, no swelling to suggest DVT.  Regarding right leg numbness--overall suspect lumbar pathology given associated back pain.  Given associated headache, lightheadedness, will obtain further intracranial imaging as well as labs to evaluate for electrolyte abnormalities, EKG to evaluate for signs of arrhythmia.  Labs completed and personally about interpreted by me show no acute abnormalities, mild hyperglycemia without other acute changes.  EKG completed and personally evaluated by me shows no acute abnormalities.  CT head completed personally by by me shows no acute abnormalities.  Recommend outpatient  follow-up with PCP for wrist pain and head pressure.  Recommend follow-up with her neurosurgeon,  Dr. Malcolm in the setting of back pain and numbness.  At this time do not see red flags to suggest cauda equina or need for emergent lumbar surgery.  Patient discharged in stable condition with understanding of reasons to return.          Final Clinical Impression(s) / ED Diagnoses Final diagnoses:  Acute nonintractable headache, unspecified headache type  Acute low back pain without sciatica, unspecified back pain laterality  Right leg numbness  Pain in both wrists  Bilateral carpal tunnel syndrome    Rx / DC Orders ED Discharge Orders          Ordered    cyclobenzaprine  (FLEXERIL ) 10 MG tablet  2 times daily PRN        03/04/23 9061              Dreama Longs, MD 03/05/23 [phone]

## 2023-03-04 NOTE — ED Triage Notes (Signed)
 C/o intermittent headache x 2 weeks. Also c/o R leg pain/numbness "while walking" over the past month. Denies injuries. Seen at [facility] for the same.

## 2023-03-30 ENCOUNTER — Encounter: Payer: Self-pay | Admitting: Neurology

## 2023-05-08 NOTE — Progress Notes (Signed)
 Initial neurology clinic note  Carolyn Ingram MRN: [id number] DOB: Mar 18, 1995  Referring provider: Diamantina Providence, FNP  Primary care provider: Diamantina Providence, FNP  Reason for consult:  head pressure and leg numbness and tingling  Subjective:  This is Ms. Carolyn Ingram, a 28 y.o. right-handed female with a medical history of lumbar spondylosis s/p surgery (2016), DM, HLD, HTN, depression who presents to neurology clinic with head pressure and leg numbness and tingling. The patient is alone today.  When patient moves her eyes or head, she feels pressure. There is pain in the neck as well. The pressure is mostly in the back of her head. She rates the pain 7-8/10. It does not throb like her head does when she has high blood pressure. This has been present since around 12/2022. It is a tight sensation when moving eyes or neck. She also endorses blurry vision with 2 episodes of double vision. This is progressively getting worse. She does not think it matters whether she is laying down or not, but she does think there is more pressure when bending over and less when standing back up. She does not take anything for this.  Patient also has numbness in her right leg. This occurs when standing for more than 5 minutes or trying to walk. She has to use a riding cart when going to the store. When she sits and rests, this goes away. She endorses pain in the left leg more at rest, lower leg to foot. It also hurts to touch like she hit her leg on something, but she didn't (stinging pain and numbness). She has difficulty lifting the left leg at times.   Patient started having bowel incontinence in 03/2023. She also could not urinate when symptoms started, but then this resolved. She has started to have to use depends and put pads in bed at night. She denies saddle anesthesia.  CT head on 03/04/23 was normal.   She is having difficulty sleeping due to various pains mentioned above.  Of note, patient  had lumbar spine surgery in 2016 for radiculopathy. Her current symptoms are different then the previous symptoms though. She has not had spinal imaging since surgery in 2016.  EtOH use: none She is a never smoker. Restrictive diet: no Family history of neurologic disease: mother with "nerve issues"  MEDICATIONS:  Outpatient Encounter Medications as of 05/17/2023  Medication Sig   amLODipine (NORVASC) 10 MG tablet Take 10 mg by mouth daily.   atorvastatin (LIPITOR) 40 MG tablet Take 1 tablet by mouth daily.   Blood Glucose Monitoring Suppl (ACCU-CHEK GUIDE ME) w/Device KIT    docusate sodium (COLACE) 100 MG capsule Take 100 mg by mouth daily.   FLUoxetine HCl 60 MG TABS Take 60 mg by mouth daily.   ibuprofen (ADVIL) 800 MG tablet Take 1 tablet by mouth every 8 (eight) hours as needed.   insulin degludec (TRESIBA FLEXTOUCH) 200 UNIT/ML FlexTouch Pen Inject 40 Units into the skin daily at 6 (six) AM.   Lurasidone HCl 60 MG TABS Take 1 tablet by mouth daily.   metFORMIN (GLUCOPHAGE) 1000 MG tablet Take 1 tablet by mouth daily.   olmesartan-hydrochlorothiazide (BENICAR HCT) 20-12.5 MG tablet Take 1 tablet by mouth daily. Last Thursday.   pregabalin (LYRICA) 100 MG capsule Take 100 mg by mouth 3 (three) times daily.   tirzepatide (MOUNJARO) 7.5 MG/0.5ML Pen Inject 0.5 mLs into the skin once a week.   traZODone (DESYREL) 50 MG tablet Take  50 mg by mouth at bedtime.   amlodipine-atorvastatin (CADUET) 10-10 MG tablet Take 1 tablet by mouth daily. Last dose: Thursday. (Patient not taking: Reported on 05/17/2023)   amoxicillin (AMOXIL) 500 MG capsule Take 1 capsule (500 mg total) by mouth 3 (three) times daily. (Patient not taking: Reported on 05/17/2023)   cariprazine (VRAYLAR) capsule Take by mouth. (Patient not taking: Reported on 05/17/2023)   chlorhexidine (PERIDEX) 0.12 % solution Use as directed 5 mLs in the mouth or throat 2 (two) times daily. (Patient not taking: Reported on 05/17/2023)    cyclobenzaprine (FLEXERIL) 10 MG tablet Take 1 tablet (10 mg total) by mouth 2 (two) times daily as needed for muscle spasms. (Patient not taking: Reported on 05/17/2023)   fluvoxaMINE (LUVOX) 50 MG tablet Take 50 mg by mouth daily. (Patient not taking: Reported on 05/17/2023)   glipiZIDE (GLUCOTROL) 5 MG tablet Take 5 mg by mouth daily. (Patient not taking: Reported on 05/17/2023)   hydrOXYzine (ATARAX/VISTARIL) 10 MG tablet Take 10 mg by mouth 3 (three) times daily as needed. (Patient not taking: Reported on 05/17/2023)   insulin detemir (LEVEMIR FLEXPEN) 100 UNIT/ML FlexPen Inject 40 Units into the skin daily. (Patient not taking: Reported on 05/17/2023)   lisinopril-hydrochlorothiazide (PRINZIDE,ZESTORETIC) 10-12.5 MG tablet Take 1 tablet by mouth daily. (Patient not taking: Reported on 05/17/2023)   Semaglutide, 1 MG/DOSE, (OZEMPIC, 1 MG/DOSE,) 4 MG/3ML SOPN 1 MG SUBCUTANEOUSLY WEEKLY AS DIRECTED. (Patient not taking: Reported on 05/17/2023)   No facility-administered encounter medications on file as of 05/17/2023.    PAST MEDICAL HISTORY: Past Medical History:  Diagnosis Date   Anemia    Bipolar disorder (HCC)    Dental caries    Diabetes mellitus without complication (HCC)    Headache    HNP (herniated nucleus pulposus)    Hypertension    Impacted teeth    wisdom   Menorrhagia    Morbid obesity with BMI of 50.0-59.9, adult (HCC)    Numbness and tingling of leg    right   OCD (obsessive compulsive disorder)    Shortness of breath dyspnea    Tourette's disorder    Wears glasses     PAST SURGICAL HISTORY: Past Surgical History:  Procedure Laterality Date   LUMBAR LAMINECTOMY/DECOMPRESSION MICRODISCECTOMY Right 02/08/2015   Procedure: Microdiscectomy - right - L5-S1 ;  Surgeon: Julio Sicks, MD;  Location: [facility];  Service: Neurosurgery;  Laterality: Right;  Microdiscectomy - right - L5-S1    TOOTH EXTRACTION N/A 04/27/2017   Procedure: DENTAL RESTORATION/EXTRACTIONS;  Surgeon:  Ocie Doyne, DDS;  Location: [facility] OR;  Service: Oral Surgery;  Laterality: N/A;    ALLERGIES: Allergies  Allergen Reactions   Losartan Palpitations    FAMILY HISTORY: Family History  Problem Relation Age of Onset   Cancer Mother    Diabetes Mother    Hypertension Mother    Cancer Father    Diabetes Father    Hypertension Father     SOCIAL HISTORY: Social History   Tobacco Use   Smoking status: Never   Smokeless tobacco: Never  Vaping Use   Vaping status: Never Used  Substance Use Topics   Alcohol use: No   Drug use: No   Social History   Social History Narrative   Are you right handed or left handed? Right   Are you currently employed ? no   What is your current occupation?    Do you live at home alone? no   Who lives with you? sister  What type of home do you live in: 1 story or 2 story? one    Caffiene no    Objective:  Vital Signs:  BP (!) 153/96   Pulse (!) 103   Ht 5\' 6"  (1.676 m)   Wt (!) 319 lb (144.7 kg)   SpO2 98%   BMI 51.49 kg/m   General: General appearance: Awake and alert. No distress. Cooperative with exam.  Skin: No obvious rash or jaundice. HEENT: Atraumatic. Anicteric. Non-dilated fundoscopy attempted, but patient unable to tolerate. Neck tender to palpation over cervical paraspinal and trapezius muscles, reduced range of motion. Lungs: Non-labored breathing on room air  Extremities: No edema. No obvious deformity.  Psych: Affect appropriate.  Neurological: Mental Status: Alert. Speech fluent. No pseudobulbar affect Cranial Nerves: CNII: No RAPD. Visual fields intact. CNIII, IV, VI: PERRL. No nystagmus. EOMI. CN V: Facial sensation intact bilaterally to fine touch. CN VII: Facial muscles symmetric and strong. No ptosis at rest CN VIII: Hears finger rub well bilaterally. CN IX: No hypophonia. CN X: Palate elevates symmetrically. CN XI: Full strength shoulder shrug bilaterally. CN XII: Tongue protrusion full and midline. No  atrophy or fasciculations. No significant dysarthria Motor: Tone is normal. Strength is 5/5 in bilateral upper and lower extremities. Reflexes:  Right Left   Bicep 2+ 2+   Tricep 2+ 2+   BrRad 2+ 2+   Knee 2+ 2+   Ankle 0 0    Pathological Reflexes: Babinski: mute response bilaterally Hoffman: absent bilaterally Troemner: absent bilaterally Sensation: Pinprick: Diminished in RLE below the knee, otherwise intact Vibration: Intact in all extremities Proprioception: Intact in bilateral great toes Coordination: Intact finger-to- nose-finger bilaterally. Romberg negative. Gait: Able to rise from chair with arms crossed unassisted. Normal, narrow-based gait.   Labs and Imaging review: Internal labs: 03/04/23: CMP significant for glucose 210 CBC w/ diff unremarkable  HbA1c (03/12/2017): 13.4  Imaging/Procedures: CT head wo contrast (03/04/23): FINDINGS: Brain: No evidence of acute infarction, hemorrhage, hydrocephalus, extra-axial collection or mass lesion/mass effect.   Vascular: No hyperdense vessel or unexpected calcification.   Skull: Normal. Negative for fracture or focal lesion.   Sinuses/Orbits: Symmetric thickening of the adenoids. The mastoids and sinuses are clear.   IMPRESSION: Unremarkable appearance of the brain.  Lumbar spine xray (02/08/15): FINDINGS: Cross-table lateral radiograph of the lumbosacral spine demonstrates a metallic probe positioned at the L5-S1 interspace posteriorly. Vertebral bodies are labeled. Soft tissue spreaders are located posterior to the lumbosacral junction.   IMPRESSION: Intraoperative localization as above.  MRI lumbar spine wo contrast (12/23/14): FINDINGS: Normal signal is present in the conus medullaris which terminates at L1. Marrow signal, vertebral body heights, and alignment are normal.   Limited imaging of the abdomen is within normal limits. There is no significant adenopathy.   The disc levels at L3-4 and above  are normal.   L4-5: A central disc protrusion is present. This results in mild subarticular narrowing bilaterally. The foramina are patent.   L5-S1: A prominent central disc extrusion is present. Cephalo caudad dimension of the extrusion is 15 mm. Moderate right and mild left subarticular stenosis is present. This likely impacts the right S1 nerve root. The foramina are patent.   IMPRESSION: 1. Prominent right paracentral disc extrusion at L5-S1 with moderate right and mild left subarticular narrowing at L5-S1, likely affecting the right S1 nerve root. 2. Mild subarticular narrowing bilaterally at L4-5 secondary to a central disc protrusion.  EMG (03/02/2017 Dr Alvester Morin): EMG & NCV Findings: Evaluation of  the right median motor nerve showed prolonged distal onset latency (6.6 ms) and decreased conduction velocity (Elbow-Wrist, 43 m/s).  The right median (across palm) sensory nerve showed prolonged distal peak latency (Wrist, 9.2 ms), reduced amplitude (4.0 V), and prolonged distal peak latency (Palm, 3.0 ms).  All remaining nerves (as indicated in the following tables) were within normal limits.     All examined muscles (as indicated in the following table) showed no evidence of electrical instability.     Impression: The above electrodiagnostic study is ABNORMAL and reveals evidence of a moderate right median nerve entrapment at the wrist (carpal tunnel syndrome) affecting sensory and motor components.    There is no significant electrodiagnostic evidence of any other focal nerve entrapment, brachial plexopathy or cervical radiculopathy.   Assessment/Plan:  Carolyn Ingram is a 28 y.o. female who presents for evaluation of head pressure and leg numbness and tingling. She has a relevant medical history of lumbar spondylosis s/p surgery (2016), DM, HLD, HTN, depression. Her neurological examination is pertinent for asymmetric sensory loss in RLE. She also has tight neck and reduced range of  motion of neck. Available diagnostic data is significant for CT head showing no acute process. HbA1c in 2019 was 13.4. MRI lumbar spine in 2016 showed multilevel foraminal stenosis.   Patient's leg pain and weakness of RLE when walking combined with bowel and bladder changes could be concerning for cauda equina or conus medularis compression. I discussed this with patient, including the potential need for urgent evaluation and MRI at ED, but patient declined and would rather do as an outpatient.   Regarding her head pressure, this could be related to her neck pain and tightness. Alternatively, IIH is also on the ddx. She could not tolerate non-dilated fundoscopy today. I will refer to ophtho for evaluation and consider LP if there is concern for papilledema. Patient would prefer to avoid LP if possible and medications if possible.  PLAN: -Blood work: HbA1c, TSH, B12  -MRI lumbar spine wo contrast - stat; discussed going to ED for MRI today, but patient declines. She was told to go to ED if she lost ability to walk to use restroom.  -Ophthalmology referral for dilated fundoscopy - stat (?papilledema) -May consider LP with opening pressure if concern for papilledema -Discussed that if patient loses vision, she should urgently go to ED for LP -Physical therapy for cervicalgia and low back pain  -Return to clinic in about 1-2 months  The impression above as well as the plan as outlined below were extensively discussed with the patient who voiced understanding. All questions were answered to their satisfaction.  The patient was counseled on pertinent fall precautions per the printed material provided today, and as noted under the "Patient Instructions" section below.  When available, results of the above investigations and possible further recommendations will be communicated to the patient via telephone/MyChart. Patient to call office if not contacted after expected testing turnaround  time.   Total time spent reviewing records, interview, history/exam, documentation, and coordination of care on day of encounter:  60 min   Thank you for allowing me to participate in patient's care.  If I can answer any additional questions, I would be pleased to do so.  Jacquelyne Balint, MD   CC: Diamantina Providence, FNP [address] [place] [address]  CC: Referring provider: Diamantina Providence, FNP [address] [place],  [place] [address]

## 2023-05-17 ENCOUNTER — Other Ambulatory Visit

## 2023-05-17 ENCOUNTER — Ambulatory Visit (INDEPENDENT_AMBULATORY_CARE_PROVIDER_SITE_OTHER): Payer: 59 | Admitting: Neurology

## 2023-05-17 ENCOUNTER — Encounter: Payer: Self-pay | Admitting: Neurology

## 2023-05-17 VITALS — BP 130/85 | HR 103 | Ht 66.0 in | Wt 319.0 lb

## 2023-05-17 DIAGNOSIS — R519 Headache, unspecified: Secondary | ICD-10-CM

## 2023-05-17 DIAGNOSIS — R200 Anesthesia of skin: Secondary | ICD-10-CM | POA: Diagnosis not present

## 2023-05-17 DIAGNOSIS — M542 Cervicalgia: Secondary | ICD-10-CM

## 2023-05-17 DIAGNOSIS — R29898 Other symptoms and signs involving the musculoskeletal system: Secondary | ICD-10-CM | POA: Diagnosis not present

## 2023-05-17 DIAGNOSIS — H538 Other visual disturbances: Secondary | ICD-10-CM | POA: Diagnosis not present

## 2023-05-17 DIAGNOSIS — M5116 Intervertebral disc disorders with radiculopathy, lumbar region: Secondary | ICD-10-CM

## 2023-05-17 DIAGNOSIS — R202 Paresthesia of skin: Secondary | ICD-10-CM

## 2023-05-17 DIAGNOSIS — G8929 Other chronic pain: Secondary | ICD-10-CM

## 2023-05-17 DIAGNOSIS — E119 Type 2 diabetes mellitus without complications: Secondary | ICD-10-CM

## 2023-05-17 DIAGNOSIS — R159 Full incontinence of feces: Secondary | ICD-10-CM

## 2023-05-17 DIAGNOSIS — M545 Low back pain, unspecified: Secondary | ICD-10-CM

## 2023-05-17 NOTE — Addendum Note (Signed)
 Addended by: Lenise Herald on: 05/17/2023 09:51 AM   Modules accepted: Orders

## 2023-05-17 NOTE — Addendum Note (Signed)
 Addended by: Lenise Herald on: 05/17/2023 12:48 PM   Modules accepted: Orders

## 2023-05-17 NOTE — Addendum Note (Signed)
 Addended by: Lenise Herald on: 05/17/2023 10:24 AM   Modules accepted: Orders

## 2023-05-17 NOTE — Patient Instructions (Signed)
 I saw you today for head pressure, blurry vision, and neck pain. This could be due to a tight neck or increased pressure in your head from build up of fluid.   I would like to investigate this with labs today and sending you to an eye doctor to dilate your eyes and look for evidence of increased pressure. If you do not hear from someone in 1-2 weeks, please let us know.  I also saw you for leg numbness and tingling. I am concerned this is coming from your back, particularly because of your problems going to the bathroom. I need to get an MRI of your low back (lumbar spine). We discussed that you could go to the ED today to get it done, but you wanted me to order and try to get it without going to the ED. I will order it stat, so if you do not get a call to schedule by early next week, please let me know.  If you have vision loss, cannot walk, or cannot use the restroom, you need to urgently go to the ED as this could be an emergency.  I will be in touch when I have your results to discuss next steps. Please let me know if you have any questions or concerns in the meantime.   The physicians and staff at [facility] are committed to providing excellent care. You may receive a survey requesting feedback about your experience at our office. We strive to receive "very good" responses to the survey questions. If you feel that your experience would prevent you from giving the office a "very good " response, please contact our office to try to remedy the situation. We may be reached at [phone]. Thank you for taking the time out of your busy day to complete the survey.  Jacquelyne Balint, MD [facility]  Preventing Falls at [facility] are common, often dreaded events in the lives of older people. Aside from the obvious injuries and even death that may result, fall can cause wide-ranging consequences including loss of independence, mental decline, decreased activity and mobility. Younger people  are also at risk of falling, especially those with chronic illnesses and fatigue.  Ways to reduce risk for falling Examine diet and medications. Warm foods and alcohol dilate blood vessels, which can lead to dizziness when standing. Sleep aids, antidepressants and pain medications can also increase the likelihood of a fall.  Get a vision exam. Poor vision, cataracts and glaucoma increase the chances of falling.  Check foot gear. Shoes should fit snugly and have a sturdy, nonskid sole and a broad, low heel  Participate in a physician-approved exercise program to build and maintain muscle strength and improve balance and coordination. Programs that use ankle weights or stretch bands are excellent for muscle-strengthening. Water aerobics programs and low-impact Tai Chi programs have also been shown to improve balance and coordination.  Increase vitamin D intake. Vitamin D improves muscle strength and increases the amount of calcium the body is able to absorb and deposit in bones.  How to prevent falls from common hazards Floors - Remove all loose wires, cords, and throw rugs. Minimize clutter. Make sure rugs are anchored and smooth. Keep furniture in its usual place.  Chairs -- Use chairs with straight backs, armrests and firm seats. Add firm cushions to existing pieces to add height.  Bathroom - Install grab bars and non-skid tape in the tub or shower. Use a bathtub transfer bench or a shower chair  with a back support Use an elevated toilet seat and/or safety rails to assist standing from a low surface. Do not use towel racks or bathroom tissue holders to help you stand.  Lighting - Make sure halls, stairways, and entrances are well-lit. Install a night light in your bathroom or hallway. Make sure there is a light switch at the top and bottom of the staircase. Turn lights on if you get up in the middle of the night. Make sure lamps or light switches are within reach of the bed if you have to get up  during the night.  Kitchen - Install non-skid rubber mats near the sink and stove. Clean spills immediately. Store frequently used utensils, pots, pans between waist and eye level. This helps prevent reaching and bending. Sit when getting things out of lower cupboards.  Living room/ Bedrooms - Place furniture with wide spaces in between, giving enough room to move around. Establish a route through the living room that gives you something to hold onto as you walk.  Stairs - Make sure treads, rails, and rugs are secure. Install a rail on both sides of the stairs. If stairs are a threat, it might be helpful to arrange most of your activities on the lower level to reduce the number of times you must climb the stairs.  Entrances and doorways - Install metal handles on the walls adjacent to the doorknobs of all doors to make it more secure as you travel through the doorway.  Tips for maintaining balance Keep at least one hand free at all times. Try using a backpack or fanny pack to hold things rather than carrying them in your hands. Never carry objects in both hands when walking as this interferes with keeping your balance.  Attempt to swing both arms from front to back while walking. This might require a conscious effort if Parkinson's disease has diminished your movement. It will, however, help you to maintain balance and posture, and reduce fatigue.  Consciously lift your feet off of the ground when walking. Shuffling and dragging of the feet is a common culprit in losing your balance.  When trying to navigate turns, use a "U" technique of facing forward and making a wide turn, rather than pivoting sharply.  Try to stand with your feet shoulder-length apart. When your feet are close together for any length of time, you increase your risk of losing your balance and falling.  Do one thing at a time. Don't try to walk and accomplish another task, such as reading or looking around. The decrease in  your automatic reflexes complicates motor function, so the less distraction, the better.  Do not wear rubber or gripping soled shoes, they might "catch" on the floor and cause tripping.  Move slowly when changing positions. Use deliberate, concentrated movements and, if needed, use a grab bar or walking aid. Count 15 seconds between each movement. For example, when rising from a seated position, wait 15 seconds after standing to begin walking.  If balance is a continuous problem, you might want to consider a walking aid such as a cane, walking stick, or walker. Once you've mastered walking with help, you might be ready to try it on your own again.

## 2023-05-18 ENCOUNTER — Encounter: Payer: Self-pay | Admitting: Neurology

## 2023-05-18 ENCOUNTER — Telehealth: Payer: Self-pay | Admitting: Neurology

## 2023-05-18 LAB — HEMOGLOBIN A1C
Hgb A1c MFr Bld: 8.6 %{Hb} — ABNORMAL HIGH (ref <5.7)
Mean Plasma Glucose: 200 mg/dL
eAG (mmol/L): 11.1 mmol/L

## 2023-05-18 LAB — TSH: TSH: 4.1 m[IU]/L

## 2023-05-18 LAB — VITAMIN B12: Vitamin B-12: 491 pg/mL (ref 200–1100)

## 2023-05-18 NOTE — Telephone Encounter (Signed)
 Understand, I already reached out to [facility] and they have her referral.

## 2023-05-18 NOTE — Telephone Encounter (Signed)
 [facility] not able to see the Pt,but we should check within [facility] to find another provider who can see Pt. sooner

## 2023-06-20 NOTE — Progress Notes (Addendum)
 I saw Carolyn Ingram in neurology clinic on 05/17/23 in follow up for head pressure, right leg numbness, and bowel incontinence.  HPI: Carolyn Ingram is a 28 y.o. year old female with a history of lumbar spondylosis s/p surgery (2016), DM, HLD, HTN, depression who we last saw on 05/17/23.  To briefly review: 05/17/23: When patient moves her eyes or head, she feels pressure. There is pain in the neck as well. The pressure is mostly in the back of her head. She rates the pain 7-8/10. It does not throb like her head does when she has high blood pressure. This has been present since around 12/2022. It is a tight sensation when moving eyes or neck. She also endorses blurry vision with 2 episodes of double vision. This is progressively getting worse. She does not think it matters whether she is laying down or not, but she does think there is more pressure when bending over and less when standing back up. She does not take anything for this.   Patient also has numbness in her right leg. This occurs when standing for more than 5 minutes or trying to walk. She has to use a riding cart when going to the store. When she sits and rests, this goes away. She endorses pain in the left leg more at rest, lower leg to foot. It also hurts to touch like she hit her leg on something, but she didn't (stinging pain and numbness). She has difficulty lifting the left leg at times.    Patient started having bowel incontinence in 03/2023. She also could not urinate when symptoms started, but then this resolved. She has started to have to use depends and put pads in bed at night. She denies saddle anesthesia.   CT head on 03/04/23 was normal.    She is having difficulty sleeping due to various pains mentioned above.   Of note, patient had lumbar spine surgery in 2016 for radiculopathy. Her current symptoms are different then the previous symptoms though. She has not had spinal imaging since surgery in 2016.   EtOH use:  none She is a never smoker. Restrictive diet: no Family history of neurologic disease: mother with nerve issues  Most recent Assessment and Plan (05/17/23): Carolyn Ingram is a 28 y.o. female who presents for evaluation of head pressure and leg numbness and tingling. She has a relevant medical history of lumbar spondylosis s/p surgery (2016), DM, HLD, HTN, depression. Her neurological examination is pertinent for asymmetric sensory loss in RLE. She also has tight neck and reduced range of motion of neck. Available diagnostic data is significant for CT head showing no acute process. HbA1c in 2019 was 13.4. MRI lumbar spine in 2016 showed multilevel foraminal stenosis.    Patient's leg pain and weakness of RLE when walking combined with bowel and bladder changes could be concerning for cauda equina or conus medularis compression. I discussed this with patient, including the potential need for urgent evaluation and MRI at ED, but patient declined and would rather do as an outpatient.    Regarding her head pressure, this could be related to her neck pain and tightness. Alternatively, IIH is also on the ddx. She could not tolerate non-dilated fundoscopy today. I will refer to ophtho for evaluation and consider LP if there is concern for papilledema. Patient would prefer to avoid LP if possible and medications if possible.   PLAN: -Blood work: HbA1c, TSH, B12   -MRI lumbar spine wo contrast -  stat; discussed going to ED for MRI today, but patient declines. She was told to go to ED if she lost ability to walk to use restroom.   -Ophthalmology referral for dilated fundoscopy - stat (?papilledema) -May consider LP with opening pressure if concern for papilledema -Discussed that if patient loses vision, she should urgently go to ED for LP -Physical therapy for cervicalgia and low back pain   -Return to clinic in about 1-2 months  Since their last visit: Labs were significant for HbA1c of 8.6 (was  13.4 six years prior), but otherwise normal.  MRI lumbar spine w/wo contrast showed normal conus and caudia equina. There was some progression (mild) of disc degeneration at L5-S1 that may affect left S1 root as well. I spoke to patient on phone on 06/25/23 about the results. Per my phone note: I spoke to patient on phone about MRI lumbar spine. There was some mild to moderate stenosis but no pressure on conus or caudia equina. She reports her right leg has improved some as has her bowel and bladder symptoms. 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 She has not heard from PT.   Regarding her head pressure, this continues to be an issue for patient. She denies any vision loss. She is to see [facility] for eye exam, but has not scheduled this yet. I recommended she do so ASAP. 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  She is still improved from prior. She can stand and walk a little longer now. She can still only stand about 10 minutes.  Regarding her head pressure, she still has this. She denies vision loss, but when she stands, light can become bright. She will go into a dark room. She also endorses hearing a whooshing sound in her head as well. Her left side of head seems to be worse than right. She feels like someone is pushing on the back of her head. She feels it more when she is up and moving, not in the morning when she wakes up though. She started Topamax  25 mg but has not noticed a difference in symptoms. She has no clear side effects from the medication.  She is scheduled to see [facility] care but this is not until 07/2023.  She is starting PT next week (07/11/23).    MEDICATIONS:  Outpatient Encounter Medications as of 07/04/2023  Medication Sig   amLODipine (NORVASC) 10 MG tablet Take 10 mg by mouth daily.   atorvastatin (LIPITOR) 40 MG tablet Take 1 tablet by mouth daily.   Blood Glucose Monitoring Suppl  (ACCU-CHEK GUIDE ME) w/Device KIT    FLUoxetine HCl 60 MG TABS Take 60 mg by mouth daily.   ibuprofen (ADVIL) 800 MG tablet Take 1 tablet by mouth every 8 (eight) hours as needed.   insulin  degludec (TRESIBA FLEXTOUCH) 200 UNIT/ML FlexTouch Pen Inject 40 Units into the skin daily at 6 (six) AM.   Lurasidone HCl 60 MG TABS Take 1 tablet by mouth daily.   metFORMIN (GLUCOPHAGE) 1000 MG tablet Take 1 tablet by mouth daily.   olmesartan-hydrochlorothiazide (BENICAR HCT) 20-12.5 MG tablet Take 1 tablet by mouth daily. Last Thursday.   pregabalin (LYRICA) 100 MG capsule Take 100 mg by mouth 3 (three) times daily.   tirzepatide (MOUNJARO) 7.5 MG/0.5ML Pen Inject 0.5 mLs into the skin once a week.   topiramate  (TOPAMAX ) 25 MG tablet Take 1 tablet (25  mg total) by mouth daily.   traZODone (DESYREL) 50 MG tablet Take 50 mg by mouth at bedtime.   amlodipine-atorvastatin (CADUET) 10-10 MG tablet Take 1 tablet by mouth daily. Last dose: Thursday. (Patient not taking: Reported on 07/04/2023)   amoxicillin  (AMOXIL ) 500 MG capsule Take 1 capsule (500 mg total) by mouth 3 (three) times daily. (Patient not taking: Reported on 07/04/2023)   cariprazine (VRAYLAR) capsule Take by mouth. (Patient not taking: Reported on 07/04/2023)   chlorhexidine  (PERIDEX) 0.12 % solution Use as directed 5 mLs in the mouth or throat 2 (two) times daily. (Patient not taking: Reported on 07/04/2023)   cyclobenzaprine  (FLEXERIL ) 10 MG tablet Take 1 tablet (10 mg total) by mouth 2 (two) times daily as needed for muscle spasms. (Patient not taking: Reported on 07/04/2023)   docusate sodium (COLACE) 100 MG capsule Take 100 mg by mouth daily. (Patient not taking: Reported on 07/04/2023)   fluvoxaMINE (LUVOX) 50 MG tablet Take 50 mg by mouth daily. (Patient not taking: Reported on 05/17/2023)   glipiZIDE (GLUCOTROL) 5 MG tablet Take 5 mg by mouth daily. (Patient not taking: Reported on 05/17/2023)   hydrOXYzine (ATARAX/VISTARIL) 10 MG tablet Take 10 mg by  mouth 3 (three) times daily as needed. (Patient not taking: Reported on 07/04/2023)   insulin  detemir (LEVEMIR FLEXPEN) 100 UNIT/ML FlexPen Inject 40 Units into the skin daily. (Patient not taking: Reported on 07/04/2023)   lisinopril -hydrochlorothiazide (PRINZIDE,ZESTORETIC) 10-12.5 MG tablet Take 1 tablet by mouth daily. (Patient not taking: Reported on 05/17/2023)   Semaglutide, 1 MG/DOSE, (OZEMPIC, 1 MG/DOSE,) 4 MG/3ML SOPN 1 MG SUBCUTANEOUSLY WEEKLY AS DIRECTED. (Patient not taking: Reported on 07/04/2023)   No facility-administered encounter medications on file as of 07/04/2023.    PAST MEDICAL HISTORY: Past Medical History:  Diagnosis Date   Anemia    Bipolar disorder (HCC)    Dental caries    Diabetes mellitus without complication (HCC)    Headache    HNP (herniated nucleus pulposus)    Hypertension    Impacted teeth    wisdom   Menorrhagia    Morbid obesity with BMI of 50.0-59.9, adult (HCC)    Numbness and tingling of leg    right   OCD (obsessive compulsive disorder)    Shortness of breath dyspnea    Tourette's disorder    Wears glasses     PAST SURGICAL HISTORY: Past Surgical History:  Procedure Laterality Date   LUMBAR LAMINECTOMY/DECOMPRESSION MICRODISCECTOMY Right 02/08/2015   Procedure: Microdiscectomy - right - L5-S1 ;  Surgeon: Victory Gunnels, MD;  Location: [facility];  Service: Neurosurgery;  Laterality: Right;  Microdiscectomy - right - L5-S1    TOOTH EXTRACTION N/A 04/27/2017   Procedure: DENTAL RESTORATION/EXTRACTIONS;  Surgeon: Sheryle Hamilton, DDS;  Location: [facility] OR;  Service: Oral Surgery;  Laterality: N/A;    ALLERGIES: Allergies  Allergen Reactions   Losartan Palpitations    FAMILY HISTORY: Family History  Problem Relation Age of Onset   Cancer Mother    Diabetes Mother    Hypertension Mother    Cancer Father    Diabetes Father    Hypertension Father     SOCIAL HISTORY: Social History   Tobacco Use   Smoking status: Never   Smokeless tobacco:  Never  Vaping Use   Vaping status: Never Used  Substance Use Topics   Alcohol use: No   Drug use: No   Social History   Social History Narrative   Are you right handed or left handed?  Right   Are you currently employed ? no   What is your current occupation?    Do you live at home alone? no   Who lives with you? sister   What type of home do you live in: 1 story or 2 story? one    Caffiene no    Objective:  Vital Signs:  BP (!) 146/109   Pulse 72   Ht 5' 6 (1.676 m)   Wt (!) 312 lb (141.5 kg)   SpO2 98%   BMI 50.36 kg/m   General: General appearance: Awake and alert. No distress. Cooperative with exam.  Skin: No obvious rash or jaundice. HEENT: Atraumatic. Anicteric. Paraspinal/trapezius tenderness. Reduced range of motion of neck. Disc margins appear clear without obvious papilledema on my non-dilated fundoscopy. Lungs: Non-labored breathing on room air  Heart: Regular Extremities: No edema. No obvious deformity.   Neurological: Mental Status: Alert. Speech fluent. No pseudobulbar affect Cranial Nerves: CNII: No RAPD. Visual fields intact. CNIII, IV, VI: PERRL. No nystagmus. EOMI. CN V: Facial sensation intact bilaterally to fine touch. CN VII: Facial muscles symmetric and strong. No ptosis at rest. CN VIII: Hears finger rub well bilaterally. CN IX: No hypophonia. CN X: Palate elevates symmetrically. CN XI: Full strength shoulder shrug bilaterally. CN XII: Tongue protrusion full and midline. No atrophy or fasciculations. No significant dysarthria Motor: Tone is normal. Strength 5/5 in bilateral upper and lower extremities Reflexes:  Right Left  Bicep 2+ 2+  Tricep 2+ 2+  BrRad 2+ 2+  Knee 2+ 2+  Ankle Trace Trace   Sensation: Pinprick: Diminished in lateral aspect of RLE, otherwise intact Coordination: Intact finger-to- nose-finger bilaterally. Gait: Able to rise from chair with arms crossed unassisted. Normal, narrow-based gait.    Lab and Test  Review: New results: 05/17/23: B12: 491 TSH wnl HbA1c: 8.6  MRI lumbar spine w/wo contrast (06/24/23): FINDINGS: Segmentation:  Standard.   Alignment:  Physiologic.   Vertebrae: No fracture, evidence of discitis, or bone lesion. Degenerative fatty marrow conversion at L5-S1 endplates.   Conus medullaris and cauda equina: Conus extends to the L1 level. Conus and cauda equina appear normal.   Paraspinal and other soft tissues: Expected postoperative scarring and fatty atrophy at prior posterior decompression.   Disc levels:   L4-5: Mild disc desiccation and narrowing with central protrusion that is chronic, no static nerve root compression. Mild degenerative facet spurring.   L5-S1: Disc desiccation and narrowing with chronic endplate degeneration. Disc protrusion with prior microdiscectomy via right laminotomy. Residual left para median herniation could affect the left S1 nerve root. Bulging disc causes noncompressive/mild right foraminal narrowing.   IMPRESSION: Compared to 2016, mild progression of disc degeneration at L5-S1 with chronic herniation post micro discectomy on the right. Increased left paramedian herniation could affect the left S1 nerve root at the subarticular recess.   L4-5 chronic noncompressive central disc protrusion.  Previously reviewed results: 03/04/23: CMP significant for glucose 210 CBC w/ diff unremarkable   HbA1c (03/12/2017): 13.4   Imaging/Procedures: CT head wo contrast (03/04/23): FINDINGS: Brain: No evidence of acute infarction, hemorrhage, hydrocephalus, extra-axial collection or mass lesion/mass effect.   Vascular: No hyperdense vessel or unexpected calcification.   Skull: Normal. Negative for fracture or focal lesion.   Sinuses/Orbits: Symmetric thickening of the adenoids. The mastoids and sinuses are clear.   IMPRESSION: Unremarkable appearance of the brain.   Lumbar spine xray (02/08/15): FINDINGS: Cross-table lateral  radiograph of the lumbosacral spine demonstrates a metallic probe positioned at  the L5-S1 interspace posteriorly. Vertebral bodies are labeled. Soft tissue spreaders are located posterior to the lumbosacral junction.   IMPRESSION: Intraoperative localization as above.   MRI lumbar spine wo contrast (12/23/14): FINDINGS: Normal signal is present in the conus medullaris which terminates at L1. Marrow signal, vertebral body heights, and alignment are normal.   Limited imaging of the abdomen is within normal limits. There is no significant adenopathy.   The disc levels at L3-4 and above are normal.   L4-5: A central disc protrusion is present. This results in mild subarticular narrowing bilaterally. The foramina are patent.   L5-S1: A prominent central disc extrusion is present. Cephalo caudad dimension of the extrusion is 15 mm. Moderate right and mild left subarticular stenosis is present. This likely impacts the right S1 nerve root. The foramina are patent.   IMPRESSION: 1. Prominent right paracentral disc extrusion at L5-S1 with moderate right and mild left subarticular narrowing at L5-S1, likely affecting the right S1 nerve root. 2. Mild subarticular narrowing bilaterally at L4-5 secondary to a central disc protrusion.   EMG (03/02/2017 Dr Eldonna): EMG & NCV Findings: Evaluation of the right median motor nerve showed prolonged distal onset latency (6.6 ms) and decreased conduction velocity (Elbow-Wrist, 43 m/s).  The right median (across palm) sensory nerve showed prolonged distal peak latency (Wrist, 9.2 ms), reduced amplitude (4.0 V), and prolonged distal peak latency (Palm, 3.0 ms).  All remaining nerves (as indicated in the following tables) were within normal limits.     All examined muscles (as indicated in the following table) showed no evidence of electrical instability.     Impression: The above electrodiagnostic study is ABNORMAL and reveals evidence of a moderate  right median nerve entrapment at the wrist (carpal tunnel syndrome) affecting sensory and motor components.    There is no significant electrodiagnostic evidence of any other focal nerve entrapment, brachial plexopathy or cervical radiculopathy.   ASSESSMENT: This is [name], a 28 y.o. female with: Leg numbness and tingling with inability to stand for longer than 10 minutes. Initially was concerned for conus or cauda equina compression given that she reported bowel/bladder incontinence. This and her right leg have improved some though. Her MRI lumbar spine showed some mild progression of known lumbar spine disease without conus or cauda equina compression. 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 Head pressure - concerning for IIH, though does not match perfectly. She was started on Topamax  25 mg about 2 weeks ago. She has not noticed a change in symptoms yet. Patient would like to try to avoid LP if possible. Given that I do not see clear papilledema today, this is reasonable. We discussed that LP may be necessary pending how she responds and what ophtho sees.  Plan: -Increase Topamax  to 50 mg daily -Start PT as planned next week for neck and back pain -See ophtho as planned -Agree with patient seeing psychology for anxiety  Return to clinic in 3 months  ADDENDUM 08/22/23 at 11:50 am: PT sent me the following message today: Good morning Dr. Leigh, I'm the PT who worked with this patient for the first time today. Today she mentioned to me that when she has her positional dizziness, she will occasionally experience transient speech issues (will say the opposite word of what she intends). She didn't experience it in clinic with us  today and she states it is not new or progressive, but she did say she was unsure if she had mentioned it to other providers  before. I just wanted to give you a heads up in case it would be helpful information, as so far her symptoms don't seem to  be changing much with our interventions and that is not a common symptom for us  to hear in clinic. Please let me know if you have any questions or concerns, or if I can be of any help. Thanks for your time!  Total time spent reviewing records, interview, history/exam, documentation, and coordination of care on day of encounter:  45 min  Venetia Potters, MD

## 2023-06-24 ENCOUNTER — Ambulatory Visit
Admission: RE | Admit: 2023-06-24 | Discharge: 2023-06-24 | Disposition: A | Discharge status: Home / Self Care | Source: Ambulatory Visit | Attending: Neurology

## 2023-06-24 DIAGNOSIS — R200 Anesthesia of skin: Secondary | ICD-10-CM

## 2023-06-24 DIAGNOSIS — H538 Other visual disturbances: Secondary | ICD-10-CM

## 2023-06-24 DIAGNOSIS — M542 Cervicalgia: Secondary | ICD-10-CM

## 2023-06-24 DIAGNOSIS — R29898 Other symptoms and signs involving the musculoskeletal system: Secondary | ICD-10-CM

## 2023-06-24 DIAGNOSIS — G8929 Other chronic pain: Secondary | ICD-10-CM

## 2023-06-24 DIAGNOSIS — R519 Headache, unspecified: Secondary | ICD-10-CM

## 2023-06-24 MED ORDER — GADOPICLENOL 0.5 MMOL/ML IV SOLN
10.0000 mL | Freq: Once | INTRAVENOUS | Status: AC | PRN
  Administered 2023-06-24: 10 mL via INTRAVENOUS

## 2023-06-25 ENCOUNTER — Telehealth: Payer: Self-pay | Admitting: Neurology

## 2023-06-25 DIAGNOSIS — R519 Headache, unspecified: Secondary | ICD-10-CM

## 2023-06-25 DIAGNOSIS — G932 Benign intracranial hypertension: Secondary | ICD-10-CM

## 2023-06-25 MED ORDER — TOPIRAMATE 25 MG PO TABS: 25.0000 mg | ORAL_TABLET | Freq: Every day | ORAL | 5 refills | Status: DC

## 2023-06-25 NOTE — Telephone Encounter (Signed)
 I spoke to patient on phone about MRI lumbar spine. There was some mild to moderate stenosis but no pressure on conus or caudia equina. She reports her right leg has improved some as has her bowel and bladder symptoms. 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 She has not heard from PT.  Regarding her head pressure, this continues to be an issue for patient. She denies any vision loss. She is to see [facility] for eye exam, but has not scheduled this yet. I recommended she do so ASAP. In the mean time, I will prescribe Topamax 25 mg daily for suspected IIH and see if this helps.  All questions were answered.  Carolyn Coats, MD [facility]

## 2023-06-28 ENCOUNTER — Other Ambulatory Visit: Payer: Self-pay

## 2023-06-28 DIAGNOSIS — M48061 Spinal stenosis, lumbar region without neurogenic claudication: Secondary | ICD-10-CM

## 2023-06-28 DIAGNOSIS — M542 Cervicalgia: Secondary | ICD-10-CM

## 2023-06-28 DIAGNOSIS — R29818 Other symptoms and signs involving the nervous system: Secondary | ICD-10-CM

## 2023-07-04 ENCOUNTER — Ambulatory Visit (INDEPENDENT_AMBULATORY_CARE_PROVIDER_SITE_OTHER): Admitting: Neurology

## 2023-07-04 ENCOUNTER — Encounter: Payer: Self-pay | Admitting: Neurology

## 2023-07-04 VITALS — BP 112/68 | HR 72 | Ht 66.0 in | Wt 312.0 lb

## 2023-07-04 DIAGNOSIS — M542 Cervicalgia: Secondary | ICD-10-CM

## 2023-07-04 DIAGNOSIS — R519 Headache, unspecified: Secondary | ICD-10-CM | POA: Diagnosis not present

## 2023-07-04 DIAGNOSIS — M48061 Spinal stenosis, lumbar region without neurogenic claudication: Secondary | ICD-10-CM | POA: Diagnosis not present

## 2023-07-04 DIAGNOSIS — G932 Benign intracranial hypertension: Secondary | ICD-10-CM

## 2023-07-04 DIAGNOSIS — R29818 Other symptoms and signs involving the nervous system: Secondary | ICD-10-CM | POA: Diagnosis not present

## 2023-07-04 DIAGNOSIS — E119 Type 2 diabetes mellitus without complications: Secondary | ICD-10-CM

## 2023-07-04 MED ORDER — TOPIRAMATE 25 MG PO TABS: 50.0000 mg | ORAL_TABLET | Freq: Every day | ORAL | 5 refills | Status: DC

## 2023-07-04 NOTE — Patient Instructions (Signed)
 We will increase your topamax  to 50 mg daily today. Take 2 capsules daily.  Start physical therapy as planned for your neck and back. If you continue to have back problems, you may need to be evaluated by spine specialists again.  See the eye doctor as plan in June. Have them send me the notes.  Please call or send me a MyChart message with new or worsening symptoms, including vision loss or vision changes.  The physicians and staff at [facility] are committed to providing excellent care. You may receive a survey requesting feedback about your experience at our office. We strive to receive "very good" responses to the survey questions. If you feel that your experience would prevent you from giving the office a "very good " response, please contact our office to try to remedy the situation. We may be reached at [phone]. Thank you for taking the time out of your busy day to complete the survey.  Rommie Coats, MD [facility]

## 2023-07-11 ENCOUNTER — Ambulatory Visit

## 2023-08-01 ENCOUNTER — Other Ambulatory Visit: Payer: Self-pay

## 2023-08-01 ENCOUNTER — Ambulatory Visit: Attending: Neurology

## 2023-08-01 DIAGNOSIS — R262 Difficulty in walking, not elsewhere classified: Secondary | ICD-10-CM | POA: Diagnosis present

## 2023-08-01 DIAGNOSIS — M5416 Radiculopathy, lumbar region: Secondary | ICD-10-CM | POA: Diagnosis present

## 2023-08-01 DIAGNOSIS — R29818 Other symptoms and signs involving the nervous system: Secondary | ICD-10-CM | POA: Insufficient documentation

## 2023-08-01 DIAGNOSIS — M48061 Spinal stenosis, lumbar region without neurogenic claudication: Secondary | ICD-10-CM | POA: Insufficient documentation

## 2023-08-01 DIAGNOSIS — M542 Cervicalgia: Secondary | ICD-10-CM | POA: Diagnosis present

## 2023-08-01 NOTE — Therapy (Signed)
 OUTPATIENT PHYSICAL THERAPY EVALUATION   Patient Name: Carolyn Ingram MRN: [id number] DOB:Mar 10, 1995, 28 y.o., female Today's Date: 08/01/2023  END OF SESSION:  PT End of Session - 08/01/23 0920     Visit Number 1    Number of Visits 17    Date for PT Re-Evaluation 09/26/23    Authorization Type [employer] MCD/MCD    PT Start Time 0917    PT Stop Time 0957    PT Time Calculation (min) 40 min    Behavior During Therapy WFL for tasks assessed/performed             Past Medical History:  Diagnosis Date   Anemia    Bipolar disorder (HCC)    Dental caries    Diabetes mellitus without complication (HCC)    Headache    HNP (herniated nucleus pulposus)    Hypertension    Impacted teeth    wisdom   Menorrhagia    Morbid obesity with BMI of 50.0-59.9, adult (HCC)    Numbness and tingling of leg    right   OCD (obsessive compulsive disorder)    Shortness of breath dyspnea    Tourette's disorder    Wears glasses    Past Surgical History:  Procedure Laterality Date   LUMBAR LAMINECTOMY/DECOMPRESSION MICRODISCECTOMY Right 02/08/2015   Procedure: Microdiscectomy - right - L5-S1 ;  Surgeon: Agustina Aldrich, MD;  Location: [facility];  Service: Neurosurgery;  Laterality: Right;  Microdiscectomy - right - L5-S1    TOOTH EXTRACTION N/A 04/27/2017   Procedure: DENTAL RESTORATION/EXTRACTIONS;  Surgeon: Ascencion Lava, DDS;  Location: [facility];  Service: Oral Surgery;  Laterality: N/A;   Patient Active Problem List   Diagnosis Date Noted   Hemoglobin A1C greater than 9%, indicating poor diabetic control 06/07/2022   Moderate recurrent major depression (HCC) 09/27/2021   Schizophrenia (HCC) 07/05/2021   Type II diabetes mellitus (HCC) 09/13/2020   Elevated hemoglobin A1c 08/01/2020   Mixed hyperlipidemia 08/01/2020   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/01/2020   Lumbar disc herniation with radiculopathy 02/08/2015    PCP: Bari Boos, FNP  REFERRING PROVIDER: Ellene Gustin, MD  REFERRING DIAG:  Spinal stenosis of lumbar region, unspecified whether neurogenic claudication present [M48.061], Cervicalgia [M54.2], Neurogenic claudication [R29.818]   Rationale for Evaluation and Treatment: Rehabilitation  THERAPY DIAG:  Difficulty in walking, not elsewhere classified  Radiculopathy, lumbar region  Cervicalgia  ONSET DATE: 01/28/2023   SUBJECTIVE:  SUBJECTIVE STATEMENT:  Patient reports that her primary concerns today include the pressure in her head and numbness in her leg after walking >5 minutes. She first noticed these in December 2024. She states that standing in the shower doesn't bother her, but it does when standing during meal preparation and other household activities. She has difficulty bending forward to pick something up, d/t feeling lightheaded. She did have some menstrual irregularity and amenorrhea from May 2024 to December 2024, which appears to have regulated at that time.  She states that she was previously able to walk for hours and hours.    PERTINENT HISTORY:   Relevant PMHx includes: Anemia, bipolar disorder, diabetes mellitus, HNP, HTN, menorrhagia, obesity, OCD, Tourette's disorder, dyspnea, history of lumbar laminectomy/decompression with microdiscectomy, schizophrenia, depression    PAIN:  Are you having pain?  No  PRECAUTIONS: Fall  RED FLAGS: Cervical red flags: Dysphagia No, Dysmetria No, Diplopia Yes: "I see double, sometimes its blurry; it hasn't improved with new glasses", Nystagmus No, and Nausea No, Bowel or bladder incontinence: Yes: "sometimes its a little and sometimes its a lot", and Cauda equina syndrome: No    WEIGHT BEARING RESTRICTIONS: No  FALLS:  Has patient fallen in last 6 months? Yes. Number of falls 1 "my leg gave out on me"   LIVING  ENVIRONMENT: Lives with: with stairs Lives in: House/apartment Stairs: Yes: External: 5-15 steps; can reach both Has following equipment at home: Grab bars  OCCUPATION: NA  PLOF: Independent  PATIENT GOALS: To have less pain when standing and less head pain/pressure  NEXT MD VISIT: not scheduled at time of evaluation   OBJECTIVE:  Note: Objective measures were completed at Evaluation unless otherwise noted.  DIAGNOSTIC FINDINGS:  06/24/2023 MRI Lumbar Spine  IMPRESSION: Compared to 2016, mild progression of disc degeneration at L5-S1 with chronic herniation post micro discectomy on the right. Increased left paramedian herniation could affect the left S1 nerve root at the subarticular recess.   L4-5 chronic noncompressive central disc protrusion.  03/04/2023 CT Head   IMPRESSION: Compared to 2016, mild progression of disc degeneration at L5-S1 with chronic herniation post micro discectomy on the right. Increased left paramedian herniation could affect the left S1 nerve root at the subarticular recess.   L4-5 chronic noncompressive central disc protrusion.  PATIENT SURVEYS:  Modified Oswestry 30/50 = 60% disability   Most difficulty with person care activities, walking, standing, sleeping, travel, social life, and participation in household work/occupational duties  COGNITION: Overall cognitive status: Within functional limits for tasks assessed     SENSATION: Not tested   POSTURE: rounded shoulders, forward head, and increased thoracic kyphosis  PALPATION: Patient denies pain with palpation throughout cervical spine. She does have hypomobility with CPA from C2-T1.   CERVICAL ROM: grossly WFL in all directions   Active ROM A/PROM (deg) eval  Flexion   Extension   Right lateral flexion   Left lateral flexion   Right rotation   Left rotation    (Blank rows = not tested)   LUMBAR ROM: grossly WFL in all directions  AROM eval  Flexion   Extension   Right  lateral flexion   Left lateral flexion   Right rotation   Left rotation    (Blank rows = not tested)  LOWER EXTREMITY ROM:     Active  Right eval Left eval  Hip flexion    Hip extension    Hip abduction    Hip adduction    Hip internal rotation  Hip external rotation    Knee flexion    Knee extension    Ankle dorsiflexion    Ankle plantarflexion    Ankle inversion    Ankle eversion     (Blank rows = not tested)  LOWER EXTREMITY MMT:    MMT Right eval Left eval  Hip flexion 4+ 4+  Hip extension    Hip abduction 4+ 4+  Hip adduction 5 5  Hip internal rotation    Hip external rotation    Knee flexion 4 4  Knee extension 4+ 4+  Ankle dorsiflexion 4+ 4+  Ankle plantarflexion    Ankle inversion    Ankle eversion     (Blank rows = not tested)  LUMBAR SPECIAL TESTS:  Deferred for future visits as indicated  FUNCTIONAL TESTS:  Deferred for future visits as indicated  GAIT: Distance walked: 50 feet  Assistive device utilized: None Level of assistance: Complete Independence  TREATMENT DATE:   [facility] Adult PT Treatment:                                                DATE: 08/01/2023   Initial evaluation: see patient education and home exercise program as noted below                                                                                                                                  PATIENT EDUCATION:  Education details: reviewed initial home exercise program; discussion of POC, prognosis and goals for skilled PT   Person educated: Patient Education method: Explanation, Demonstration, and Handouts Education comprehension: verbalized understanding, returned demonstration, and needs further education  HOME EXERCISE PROGRAM: To be provided at follow up visit  ASSESSMENT:  CLINICAL IMPRESSION: Shajuana is a 28 y.o. female who was seen today for physical therapy evaluation and treatment for Neck Pain with radicular symptoms and Low Back Pain with  radicular symptoms. She is demonstrating decreased cervical spine mobility, decreased LE strength, decreased postural endurance. She has related pain and difficulty with prolonged standing, prolonged walking, sleeping, participation in normal social and occupation activities, heavy lifting, and performing ADLs/IADLs. She requires skilled PT services at this time to address relevant deficits and improve overall function.     OBJECTIVE IMPAIRMENTS: decreased activity tolerance, decreased endurance, decreased strength, postural dysfunction, and pain.   ACTIVITY LIMITATIONS: carrying, lifting, standing, squatting, stairs, bed mobility, and locomotion level  PARTICIPATION LIMITATIONS: meal prep, cleaning, laundry, personal finances, driving, shopping, community activity, and occupation  PERSONAL FACTORS: Past/current experiences, Time since onset of injury/illness/exacerbation, and 3+ comorbidities: Relevant PMHx includes: Anemia, bipolar disorder, diabetes mellitus, HNP, HTN, menorrhagia, obesity, OCD, Tourette's disorder, dyspnea, history of lumbar laminectomy/decompression with microdiscectomy, schizophrenia, depression are also affecting patient's functional outcome.   REHAB POTENTIAL: Fair    CLINICAL DECISION MAKING:  Evolving/moderate complexity  EVALUATION COMPLEXITY: Moderate   GOALS: Goals reviewed with patient? YES  SHORT TERM GOALS: Target date: 08/29/2023   Patient will be independent with initial home program at least 3 days/week.  Baseline: provided at eval Goal Status: INITIAL   2.  Patient will demonstrate improved postural awareness for at least 15 minutes while seated without need for cueing from PT.  Baseline: see objective measures Goal Status: INITIAL   3.  Patient will demonstrate improved LE strength to 5/5 MMT Baseline:  Goal status: INITIAL   LONG TERM GOALS: Target date: 09/26/2023    Patient will report improved overall functional ability with ODI score of  15 or less.  Baseline: 30 Goal Status: INITIAL   2.  Patient will demonstrate ability to tolerate weight bearing activities for at least 30 minutes. Baseline: unable to tolerate >10 min  Goal status: INITIAL  3.  Patient will demonstrate ability to perform floor to waist lifting of at least 20# using appropriate body mechanics and with no more than minimal pain in order to safely perform normal daily/occupational tasks.   Baseline: limited with <10lb   Goal Status: INITIAL   4.  Patient will report decreased frequency of neck and head pain/pressure to no more than 3x/week. Baseline: daily  Goal status: INITIAL    PLAN:  PT FREQUENCY: 2x/week  PT DURATION: 8 weeks  PLANNED INTERVENTIONS: 97164- PT Re-evaluation, 97750- Physical Performance Testing, 97110-Therapeutic exercises, 97530- Therapeutic activity, 97112- Neuromuscular re-education, 97535- Self Care, 16109- Manual therapy, [phone]- Aquatic Therapy, [id number]- Electrical stimulation (unattended), Patient/Family education, Balance training, Joint mobilization, Spinal mobilization, Cryotherapy, and Moist heat.  PLAN FOR NEXT SESSION: create and review HEP, further assess and tx balance, vestibular function, perform functional tests if indicated, address CS mobility and LE strength; otherwise progress towards established rehab goals   Arlester Bence, PT, DPT  08/03/2023 9:53 AM

## 2023-08-15 ENCOUNTER — Encounter: Payer: Self-pay | Admitting: Physical Therapy

## 2023-08-15 ENCOUNTER — Ambulatory Visit: Admitting: Physical Therapy

## 2023-08-15 DIAGNOSIS — R262 Difficulty in walking, not elsewhere classified: Secondary | ICD-10-CM

## 2023-08-15 DIAGNOSIS — M542 Cervicalgia: Secondary | ICD-10-CM

## 2023-08-15 DIAGNOSIS — M5416 Radiculopathy, lumbar region: Secondary | ICD-10-CM

## 2023-08-15 NOTE — Therapy (Signed)
 OUTPATIENT PHYSICAL THERAPY TREATMENT   Patient Name: Carolyn Ingram MRN: [id number] DOB:1995/07/21, 28 y.o., female Today's Date: 08/15/2023  END OF SESSION:  PT End of Session - 08/15/23 1130     Visit Number 2    Number of Visits 17    Date for PT Re-Evaluation 09/26/23    PT Start Time 1000    PT Stop Time 1040    PT Time Calculation (min) 40 min    Activity Tolerance Patient tolerated treatment well    Behavior During Therapy WFL for tasks assessed/performed           Past Medical History:  Diagnosis Date   Anemia    Bipolar disorder (HCC)    Dental caries    Diabetes mellitus without complication (HCC)    Headache    HNP (herniated nucleus pulposus)    Hypertension    Impacted teeth    wisdom   Menorrhagia    Morbid obesity with BMI of 50.0-59.9, adult (HCC)    Numbness and tingling of leg    right   OCD (obsessive compulsive disorder)    Shortness of breath dyspnea    Tourette's disorder    Wears glasses    Past Surgical History:  Procedure Laterality Date   LUMBAR LAMINECTOMY/DECOMPRESSION MICRODISCECTOMY Right 02/08/2015   Procedure: Microdiscectomy - right - L5-S1 ;  Surgeon: Agustina Aldrich, MD;  Location: [facility];  Service: Neurosurgery;  Laterality: Right;  Microdiscectomy - right - L5-S1    TOOTH EXTRACTION N/A 04/27/2017   Procedure: DENTAL RESTORATION/EXTRACTIONS;  Surgeon: Ascencion Lava, DDS;  Location: [facility];  Service: Oral Surgery;  Laterality: N/A;   Patient Active Problem List   Diagnosis Date Noted   Hemoglobin A1C greater than 9%, indicating poor diabetic control 06/07/2022   Moderate recurrent major depression (HCC) 09/27/2021   Schizophrenia (HCC) 07/05/2021   Type II diabetes mellitus (HCC) 09/13/2020   Elevated hemoglobin A1c 08/01/2020   Mixed hyperlipidemia 08/01/2020   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/01/2020   Lumbar disc herniation with radiculopathy 02/08/2015    PCP: Bari Boos, FNP  REFERRING PROVIDER: Ellene Gustin,  MD  REFERRING DIAG: Spinal stenosis of lumbar region, unspecified whether neurogenic claudication present [M48.061], Cervicalgia [M54.2], Neurogenic claudication [R29.818]   Rationale for Evaluation and Treatment: Rehabilitation  THERAPY DIAG:  Difficulty in walking, not elsewhere classified  Radiculopathy, lumbar region  Cervicalgia  ONSET DATE: 01/28/2023   SUBJECTIVE:  SUBJECTIVE STATEMENT: Pt attended today's session with reports of no pain but moderate discomfort d/t posterior L sided head pressure.   Eval statement: Patient reports that her primary concerns today include the pressure in her head and numbness in her leg after walking >5 minutes. She first noticed these in December 2024. She states that standing in the shower doesn't bother her, but it does when standing during meal preparation and other household activities. She has difficulty bending forward to pick something up, d/t feeling lightheaded. She did have some menstrual irregularity and amenorrhea from May 2024 to December 2024, which appears to have regulated at that time.  She states that she was previously able to walk for hours and hours.    PERTINENT HISTORY:   Relevant PMHx includes: Anemia, bipolar disorder, diabetes mellitus, HNP, HTN, menorrhagia, obesity, OCD, Tourette's disorder, dyspnea, history of lumbar laminectomy/decompression with microdiscectomy, schizophrenia, depression    PAIN:  Are you having pain?  No  PRECAUTIONS: Fall  RED FLAGS: Cervical red flags: Dysphagia No, Dysmetria No, Diplopia Yes: I see double, sometimes its blurry; it hasn't improved with new glasses, Nystagmus No, and Nausea No, Bowel or bladder incontinence: Yes: sometimes its a little and sometimes its a lot, and Cauda equina syndrome:  No    WEIGHT BEARING RESTRICTIONS: No  FALLS:  Has patient fallen in last 6 months? Yes. Number of falls 1 my leg gave out on me   LIVING ENVIRONMENT: Lives with: with stairs Lives in: House/apartment Stairs: Yes: External: 5-15 steps; can reach both Has following equipment at home: Grab bars  OCCUPATION: NA  PLOF: Independent  PATIENT GOALS: To have less pain when standing and less head pain/pressure  NEXT MD VISIT: not scheduled at time of evaluation   OBJECTIVE:  Note: Objective measures were completed at Evaluation unless otherwise noted.  DIAGNOSTIC FINDINGS:  06/24/2023 MRI Lumbar Spine  IMPRESSION: Compared to 2016, mild progression of disc degeneration at L5-S1 with chronic herniation post micro discectomy on the right. Increased left paramedian herniation could affect the left S1 nerve root at the subarticular recess.   L4-5 chronic noncompressive central disc protrusion.  03/04/2023 CT Head   IMPRESSION: Compared to 2016, mild progression of disc degeneration at L5-S1 with chronic herniation post micro discectomy on the right. Increased left paramedian herniation could affect the left S1 nerve root at the subarticular recess.   L4-5 chronic noncompressive central disc protrusion.  PATIENT SURVEYS:  Modified Oswestry 30/50 = 60% disability   Most difficulty with person care activities, walking, standing, sleeping, travel, social life, and participation in household work/occupational duties  COGNITION: Overall cognitive status: Within functional limits for tasks assessed     SENSATION: Not tested   POSTURE: rounded shoulders, forward head, and increased thoracic kyphosis  PALPATION: Patient denies pain with palpation throughout cervical spine. She does have hypomobility with CPA from C2-T1.   CERVICAL ROM: grossly WFL in all directions   Active ROM A/PROM (deg) eval  Flexion   Extension   Right lateral flexion   Left lateral flexion   Right  rotation   Left rotation    (Blank rows = not tested)   LUMBAR ROM: grossly WFL in all directions  AROM eval  Flexion   Extension   Right lateral flexion   Left lateral flexion   Right rotation   Left rotation    (Blank rows = not tested)  LOWER EXTREMITY ROM:     Active  Right eval Left eval  Hip flexion  Hip extension    Hip abduction    Hip adduction    Hip internal rotation    Hip external rotation    Knee flexion    Knee extension    Ankle dorsiflexion    Ankle plantarflexion    Ankle inversion    Ankle eversion     (Blank rows = not tested)  LOWER EXTREMITY MMT:    MMT Right eval Left eval  Hip flexion 4+ 4+  Hip extension    Hip abduction 4+ 4+  Hip adduction 5 5  Hip internal rotation    Hip external rotation    Knee flexion 4 4  Knee extension 4+ 4+  Ankle dorsiflexion 4+ 4+  Ankle plantarflexion    Ankle inversion    Ankle eversion     (Blank rows = not tested)  LUMBAR SPECIAL TESTS:  Deferred for future visits as indicated  FUNCTIONAL TESTS:  Deferred for future visits as indicated  GAIT: Distance walked: 50 feet  Assistive device utilized: None Level of assistance: Complete Independence  TREATMENT DATE:   [facility] Adult PT Treatment:                                                DATE: 08/15/2023 Therapeutic Activity: Evaluative testing Suboccipital distraction/release: + Dix hallpike: negative B   Self Care: HEP administration and education                                                                                                                                  PATIENT EDUCATION:  Education details: reviewed initial home exercise program; discussion of POC, prognosis and goals for skilled PT   Person educated: Patient Education method: Explanation, Demonstration, and Handouts Education comprehension: verbalized understanding, returned demonstration, and needs further education  HOME EXERCISE PROGRAM: Access Code:  [email or web address] URL: [email or web address] Date: 08/15/2023 Prepared by: Albesa Huguenin  Exercises - Seated Cervical Retraction  - 1-2 x daily - 7 x weekly - 2-3 sets - 12 reps - 8s hold - Supine Deep Neck Flexor Training - Repetitions  - 1-2 x daily - 7 x weekly - 2-3 sets - 12 reps - 8s hold - Sub-Occipital Cervical Stretch  - 1-2 x daily - 7 x weekly - 1 sets - 1 reps - 56m hold - Standing Shoulder Row with Anchored Resistance  - 1 x daily - 4 x weekly - 2-3 sets - 12 reps - 3s hold  ASSESSMENT:  CLINICAL IMPRESSION: Pt attended physical therapy session for continuation of treatment regarding neck pain and cervical pressure. Today's session focused on symptom presentation and provocation. Pt showed good tolerance to administered treatment with minimal adverse effects by the end of session. BPPV testing was negative, lateral suboccipital pressure replicated some symptoms  with L>R however did not  diminish or exacerbate with prolonged pressure. Most notable symptoms was during L side lying. Skilled intervention was utilized via activity modification for pt tolerance with task completion, functional progression/regression promoting best outcomes inline with current rehab goals, as well as moderate verbal/tactile cuing alongside no physical assistance for safe and appropriate performance of today's activities. Symptoms are vaguely replicatable with MSK testing, if no change in symptoms is noted within next couple sessions, a d/c with referral for vascular testing may be indicated.   Jashawna is a 28 y.o. female who was seen today for physical therapy evaluation and treatment for Neck Pain with radicular symptoms and Low Back Pain with radicular symptoms. She is demonstrating decreased cervical spine mobility, decreased LE strength, decreased postural endurance. She has related pain and difficulty with prolonged standing, prolonged walking, sleeping, participation in normal social and occupation  activities, heavy lifting, and performing ADLs/IADLs. She requires skilled PT services at this time to address relevant deficits and improve overall function.     OBJECTIVE IMPAIRMENTS: decreased activity tolerance, decreased endurance, decreased strength, postural dysfunction, and pain.   ACTIVITY LIMITATIONS: carrying, lifting, standing, squatting, stairs, bed mobility, and locomotion level  PARTICIPATION LIMITATIONS: meal prep, cleaning, laundry, personal finances, driving, shopping, community activity, and occupation  PERSONAL FACTORS: Past/current experiences, Time since onset of injury/illness/exacerbation, and 3+ comorbidities: Relevant PMHx includes: Anemia, bipolar disorder, diabetes mellitus, HNP, HTN, menorrhagia, obesity, OCD, Tourette's disorder, dyspnea, history of lumbar laminectomy/decompression with microdiscectomy, schizophrenia, depression are also affecting patient's functional outcome.   REHAB POTENTIAL: Fair    CLINICAL DECISION MAKING: Evolving/moderate complexity  EVALUATION COMPLEXITY: Moderate   GOALS: Goals reviewed with patient? YES  SHORT TERM GOALS: Target date: 08/29/2023   Patient will be independent with initial home program at least 3 days/week.  Baseline: provided at eval Goal Status: INITIAL   2.  Patient will demonstrate improved postural awareness for at least 15 minutes while seated without need for cueing from PT.  Baseline: see objective measures Goal Status: INITIAL   3.  Patient will demonstrate improved LE strength to 5/5 MMT Baseline:  Goal status: INITIAL   LONG TERM GOALS: Target date: 09/26/2023    Patient will report improved overall functional ability with ODI score of 15 or less.  Baseline: 30 Goal Status: INITIAL   2.  Patient will demonstrate ability to tolerate weight bearing activities for at least 30 minutes. Baseline: unable to tolerate >10 min  Goal status: INITIAL  3.  Patient will demonstrate ability to perform  floor to waist lifting of at least 20# using appropriate body mechanics and with no more than minimal pain in order to safely perform normal daily/occupational tasks.   Baseline: limited with <10lb   Goal Status: INITIAL   4.  Patient will report decreased frequency of neck and head pain/pressure to no more than 3x/week. Baseline: daily  Goal status: INITIAL    PLAN:  PT FREQUENCY: 2x/week  PT DURATION: 8 weeks  PLANNED INTERVENTIONS: 97164- PT Re-evaluation, 97750- Physical Performance Testing, 97110-Therapeutic exercises, 97530- Therapeutic activity, 97112- Neuromuscular re-education, 97535- Self Care, 08657- Manual therapy, [phone]- Aquatic Therapy, [id number]- Electrical stimulation (unattended), Patient/Family education, Balance training, Joint mobilization, Spinal mobilization, Cryotherapy, and Moist heat.  PLAN FOR NEXT SESSION:  further assess and tx balance,  perform functional tests if indicated, address CS mobility and LE strength; otherwise progress towards established rehab goals    Albesa Huguenin, PT, DPT 08/15/2023, 11:39 AM

## 2023-08-20 ENCOUNTER — Encounter: Admitting: Physical Therapy

## 2023-08-22 ENCOUNTER — Ambulatory Visit: Admitting: Physical Therapy

## 2023-08-22 ENCOUNTER — Encounter: Payer: Self-pay | Admitting: Physical Therapy

## 2023-08-22 DIAGNOSIS — R262 Difficulty in walking, not elsewhere classified: Secondary | ICD-10-CM | POA: Diagnosis not present

## 2023-08-22 DIAGNOSIS — M542 Cervicalgia: Secondary | ICD-10-CM

## 2023-08-22 DIAGNOSIS — M5416 Radiculopathy, lumbar region: Secondary | ICD-10-CM

## 2023-08-22 NOTE — Therapy (Signed)
 OUTPATIENT PHYSICAL THERAPY TREATMENT   Patient Name: Carolyn Ingram MRN: [id number] DOB:[birth year], 28 y.o., female Today's Date: 08/22/2023  END OF SESSION:  PT End of Session - 08/22/23 0914     Visit Number 3    Number of Visits 17    Date for PT Re-Evaluation 09/26/23    Authorization Type [employer] MCD/MCD    PT Start Time 0915    PT Stop Time 0954    PT Time Calculation (min) 39 min            Past Medical History:  Diagnosis Date   Anemia    Bipolar disorder (HCC)    Dental caries    Diabetes mellitus without complication (HCC)    Headache    HNP (herniated nucleus pulposus)    Hypertension    Impacted teeth    wisdom   Menorrhagia    Morbid obesity with BMI of 50.0-59.9, adult (HCC)    Numbness and tingling of leg    right   OCD (obsessive compulsive disorder)    Shortness of breath dyspnea    Tourette's disorder    Wears glasses    Past Surgical History:  Procedure Laterality Date   LUMBAR LAMINECTOMY/DECOMPRESSION MICRODISCECTOMY Right 02/08/2015   Procedure: Microdiscectomy - right - L5-S1 ;  Surgeon: Victory Gunnels, MD;  Location: [facility];  Service: Neurosurgery;  Laterality: Right;  Microdiscectomy - right - L5-S1    TOOTH EXTRACTION N/A 04/27/2017   Procedure: DENTAL RESTORATION/EXTRACTIONS;  Surgeon: Sheryle Hamilton, DDS;  Location: [facility];  Service: Oral Surgery;  Laterality: N/A;   Patient Active Problem List   Diagnosis Date Noted   Hemoglobin A1C greater than 9%, indicating poor diabetic control 06/07/2022   Moderate recurrent major depression (HCC) 09/27/2021   Schizophrenia (HCC) 07/05/2021   Type II diabetes mellitus (HCC) 09/13/2020   Elevated hemoglobin A1c 08/01/2020   Mixed hyperlipidemia 08/01/2020   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/01/2020   Lumbar disc herniation with radiculopathy 02/08/2015    PCP: Lenon Nell SAILOR, FNP  REFERRING PROVIDER: Leigh Venetia CROME, MD  REFERRING DIAG: Spinal stenosis of lumbar region, unspecified whether neurogenic  claudication present [M48.061], Cervicalgia [M54.2], Neurogenic claudication [R29.818]   Rationale for Evaluation and Treatment: Rehabilitation  THERAPY DIAG:  Difficulty in walking, not elsewhere classified  Cervicalgia  Radiculopathy, lumbar region  ONSET DATE: 01/28/2023   SUBJECTIVE:                                                                                                                                                                                           SUBJECTIVE STATEMENT: 08/22/2023: pt states she is doing okay  today, feels symptoms remain about the same. Denies new symptoms. Reports some neck pain and head pressure in keeping with her baseline, no issues after last session.    Eval statement: Patient reports that her primary concerns today include the pressure in her head and numbness in her leg after walking >5 minutes. She first noticed these in December 2024. She states that standing in the shower doesn't bother her, but it does when standing during meal preparation and other household activities. She has difficulty bending forward to pick something up, d/t feeling lightheaded. She did have some menstrual irregularity and amenorrhea from May 2024 to December 2024, which appears to have regulated at that time.  She states that she was previously able to walk for hours and hours.    PERTINENT HISTORY:   Relevant PMHx includes: Anemia, bipolar disorder, diabetes mellitus, HNP, HTN, menorrhagia, obesity, OCD, Tourette's disorder, dyspnea, history of lumbar laminectomy/decompression with microdiscectomy, schizophrenia, depression    PAIN:  Are you having pain?  No  PRECAUTIONS: Fall  RED FLAGS: Cervical red flags: Dysphagia No, Dysmetria No, Diplopia Yes: I see double, sometimes its blurry; it hasn't improved with new glasses, Nystagmus No, and Nausea No, Bowel or bladder incontinence: Yes: sometimes its a little and sometimes its a lot, and Cauda equina  syndrome: No    WEIGHT BEARING RESTRICTIONS: No  FALLS:  Has patient fallen in last 6 months? Yes. Number of falls 1 my leg gave out on me   LIVING ENVIRONMENT: Lives with: with stairs Lives in: House/apartment Stairs: Yes: External: 5-15 steps; can reach both Has following equipment at home: Grab bars  OCCUPATION: NA  PLOF: Independent  PATIENT GOALS: To have less pain when standing and less head pain/pressure  NEXT MD VISIT: Around August per pt report   OBJECTIVE:  Note: Objective measures were completed at Evaluation unless otherwise noted.  DIAGNOSTIC FINDINGS:  06/24/2023 MRI Lumbar Spine  IMPRESSION: Compared to 2016, mild progression of disc degeneration at L5-S1 with chronic herniation post micro discectomy on the right. Increased left paramedian herniation could affect the left S1 nerve root at the subarticular recess.   L4-5 chronic noncompressive central disc protrusion.  03/04/2023 CT Head   IMPRESSION: Compared to 2016, mild progression of disc degeneration at L5-S1 with chronic herniation post micro discectomy on the right. Increased left paramedian herniation could affect the left S1 nerve root at the subarticular recess.   L4-5 chronic noncompressive central disc protrusion.  PATIENT SURVEYS:  Modified Oswestry 30/50 = 60% disability   Most difficulty with person care activities, walking, standing, sleeping, travel, social life, and participation in household work/occupational duties  COGNITION: Overall cognitive status: Within functional limits for tasks assessed     SENSATION: Not tested   POSTURE: rounded shoulders, forward head, and increased thoracic kyphosis  PALPATION: Patient denies pain with palpation throughout cervical spine. She does have hypomobility with CPA from C2-T1.   CERVICAL ROM: grossly WFL in all directions   Active ROM A/PROM (deg) eval  Flexion   Extension   Right lateral flexion   Left lateral flexion    Right rotation   Left rotation    (Blank rows = not tested)   LUMBAR ROM: grossly WFL in all directions  AROM eval  Flexion   Extension   Right lateral flexion   Left lateral flexion   Right rotation   Left rotation    (Blank rows = not tested)  LOWER EXTREMITY ROM:     Active  Right eval Left eval  Hip flexion    Hip extension    Hip abduction    Hip adduction    Hip internal rotation    Hip external rotation    Knee flexion    Knee extension    Ankle dorsiflexion    Ankle plantarflexion    Ankle inversion    Ankle eversion     (Blank rows = not tested)  LOWER EXTREMITY MMT:    MMT Right eval Left eval  Hip flexion 4+ 4+  Hip extension    Hip abduction 4+ 4+  Hip adduction 5 5  Hip internal rotation    Hip external rotation    Knee flexion 4 4  Knee extension 4+ 4+  Ankle dorsiflexion 4+ 4+  Ankle plantarflexion    Ankle inversion    Ankle eversion     (Blank rows = not tested)  LUMBAR SPECIAL TESTS:  Deferred for future visits as indicated  FUNCTIONAL TESTS:  Deferred for future visits as indicated  GAIT: Distance walked: 50 feet  Assistive device utilized: None Level of assistance: Complete Independence  TREATMENT DATE:   [facility] Adult PT Treatment:                                                DATE: 08/22/23 Therapeutic Exercise: Supine DNF endurance 3x10sec hold Supine double GH flexion AROM 2x10 cues for comfortable ROM HEP update + education/handout  Neuromuscular re-ed: Supine chin tuck 2x8 cues for reduced compensations Supine scap retraction + double ER x10 unresisted, x8 w/ red band cues for reduced UT compensations Hookling marches 2x10 BIL cues for core activation, pacing, and breath control   [facility] Adult PT Treatment:                                                DATE: 08/15/2023 Therapeutic Activity: Evaluative testing Suboccipital distraction/release: + Dix hallpike: negative B   Self Care: HEP administration and  education                                                                                                                                  PATIENT EDUCATION:  Education details: rationale for interventions, HEP  Person educated: Patient Education method: Explanation, Demonstration, Tactile cues, Verbal cues Education comprehension: verbalized understanding, returned demonstration, verbal cues required, tactile cues required, and needs further education     HOME EXERCISE PROGRAM: Access Code: [email or web address] URL: [email or web address] Date: 08/22/2023 Prepared by: Alm Jenny  Exercises - Sub-Occipital Cervical Stretch  - 1-2 x daily - 7 x weekly - 1 sets - 1 reps - 27m hold - Supine Cervical Retraction with Towel  -  1-2 x daily - 7 x weekly - 1 sets - 8-10 reps - Supine Shoulder External Rotation with Resistance  - 1-2 x daily - 7 x weekly - 1 sets - 8 reps - Supine Shoulder Flexion Extension Full Range AROM  - 1-2 x daily - 7 x weekly - 1 sets - 8-10 reps  ASSESSMENT:  CLINICAL IMPRESSION: 08/22/2023: Pt arrives w/ baseline symptoms, no new symptoms or updates. Today working primarily on supine stability training as she reports best tolerance to this position. She does demonstrate reduced GH mobility and periscapular/cervical stability, tendency for inc ROM with repetition of exercises. No adverse events, denies any change in concordant symptoms on departure. Of note, she does mention that when she has some of her positional dizziness she will also have transient word finding issues (will find herself saying the opposite of what she is thinking, e.g. saying go when intending to say stop). She states this is not new or progressive but unsure if she has spoken with providers about it, does not occur during session; this Clinical research associate reached out to notify referring provider via secure chat after session. Also educated pt on red flags and indications for more emergent work up should they  occur. Recommend continuing along current POC in order to address relevant deficits and improve functional tolerance. Pt departs today's session in no acute distress, all voiced questions/concerns addressed appropriately from PT perspective.      Eval: Carolyn Ingram is a 28 y.o. female who was seen today for physical therapy evaluation and treatment for Neck Pain with radicular symptoms and Low Back Pain with radicular symptoms. She is demonstrating decreased cervical spine mobility, decreased LE strength, decreased postural endurance. She has related pain and difficulty with prolonged standing, prolonged walking, sleeping, participation in normal social and occupation activities, heavy lifting, and performing ADLs/IADLs. She requires skilled PT services at this time to address relevant deficits and improve overall function.     OBJECTIVE IMPAIRMENTS: decreased activity tolerance, decreased endurance, decreased strength, postural dysfunction, and pain.   ACTIVITY LIMITATIONS: carrying, lifting, standing, squatting, stairs, bed mobility, and locomotion level  PARTICIPATION LIMITATIONS: meal prep, cleaning, laundry, personal finances, driving, shopping, community activity, and occupation  PERSONAL FACTORS: Past/current experiences, Time since onset of injury/illness/exacerbation, and 3+ comorbidities: Relevant PMHx includes: Anemia, bipolar disorder, diabetes mellitus, HNP, HTN, menorrhagia, obesity, OCD, Tourette's disorder, dyspnea, history of lumbar laminectomy/decompression with microdiscectomy, schizophrenia, depression are also affecting patient's functional outcome.   REHAB POTENTIAL: Fair    CLINICAL DECISION MAKING: Evolving/moderate complexity  EVALUATION COMPLEXITY: Moderate   GOALS: Goals reviewed with patient? YES  SHORT TERM GOALS: Target date: 08/29/2023   Patient will be independent with initial home program at least 3 days/week.  Baseline: provided at eval Goal Status: INITIAL    2.  Patient will demonstrate improved postural awareness for at least 15 minutes while seated without need for cueing from PT.  Baseline: see objective measures Goal Status: INITIAL   3.  Patient will demonstrate improved LE strength to 5/5 MMT Baseline:  Goal status: INITIAL   LONG TERM GOALS: Target date: 09/26/2023    Patient will report improved overall functional ability with ODI score of 15 or less.  Baseline: 30 Goal Status: INITIAL   2.  Patient will demonstrate ability to tolerate weight bearing activities for at least 30 minutes. Baseline: unable to tolerate >10 min  Goal status: INITIAL  3.  Patient will demonstrate ability to perform floor to waist lifting of at least 20# using  appropriate body mechanics and with no more than minimal pain in order to safely perform normal daily/occupational tasks.   Baseline: limited with <10lb   Goal Status: INITIAL   4.  Patient will report decreased frequency of neck and head pain/pressure to no more than 3x/week. Baseline: daily  Goal status: INITIAL    PLAN:  PT FREQUENCY: 2x/week  PT DURATION: 8 weeks  PLANNED INTERVENTIONS: 97164- PT Re-evaluation, 97750- Physical Performance Testing, 97110-Therapeutic exercises, 97530- Therapeutic activity, 97112- Neuromuscular re-education, 97535- Self Care, [id number]- Manual therapy, [phone]- Aquatic Therapy, [id number]- Electrical stimulation (unattended), Patient/Family education, Balance training, Joint mobilization, Spinal mobilization, Cryotherapy, and Moist heat.  PLAN FOR NEXT SESSION:  further assess and tx balance,  perform functional tests if indicated, address CS mobility and LE strength; otherwise progress towards established rehab goals    Alm DELENA Jenny PT, DPT 08/22/2023 11:21 AM

## 2023-08-28 ENCOUNTER — Encounter

## 2023-08-30 ENCOUNTER — Ambulatory Visit: Attending: Neurology

## 2023-08-30 DIAGNOSIS — M5416 Radiculopathy, lumbar region: Secondary | ICD-10-CM | POA: Diagnosis present

## 2023-08-30 DIAGNOSIS — M542 Cervicalgia: Secondary | ICD-10-CM | POA: Diagnosis present

## 2023-08-30 DIAGNOSIS — R262 Difficulty in walking, not elsewhere classified: Secondary | ICD-10-CM | POA: Diagnosis present

## 2023-08-30 NOTE — Therapy (Signed)
 OUTPATIENT PHYSICAL THERAPY TREATMENT   Patient Name: Carolyn Ingram MRN: [id number] DOB:1996-02-08, 28 y.o., female Today's Date: 08/30/2023  END OF SESSION:  PT End of Session - 08/30/23 0909     Visit Number 4    Number of Visits 17    Date for PT Re-Evaluation 09/26/23    Authorization Type [employer] MCD/MCD    PT Start Time 0910    PT Stop Time 0942    PT Time Calculation (min) 32 min    Activity Tolerance Patient tolerated treatment well    Behavior During Therapy WFL for tasks assessed/performed             Past Medical History:  Diagnosis Date   Anemia    Bipolar disorder (HCC)    Dental caries    Diabetes mellitus without complication (HCC)    Headache    HNP (herniated nucleus pulposus)    Hypertension    Impacted teeth    wisdom   Menorrhagia    Morbid obesity with BMI of 50.0-59.9, adult (HCC)    Numbness and tingling of leg    right   OCD (obsessive compulsive disorder)    Shortness of breath dyspnea    Tourette's disorder    Wears glasses    Past Surgical History:  Procedure Laterality Date   LUMBAR LAMINECTOMY/DECOMPRESSION MICRODISCECTOMY Right 02/08/2015   Procedure: Microdiscectomy - right - L5-S1 ;  Surgeon: Victory Gunnels, MD;  Location: [facility];  Service: Neurosurgery;  Laterality: Right;  Microdiscectomy - right - L5-S1    TOOTH EXTRACTION N/A 04/27/2017   Procedure: DENTAL RESTORATION/EXTRACTIONS;  Surgeon: Sheryle Hamilton, DDS;  Location: [facility];  Service: Oral Surgery;  Laterality: N/A;   Patient Active Problem List   Diagnosis Date Noted   Hemoglobin A1C greater than 9%, indicating poor diabetic control 06/07/2022   Moderate recurrent major depression (HCC) 09/27/2021   Schizophrenia (HCC) 07/05/2021   Type II diabetes mellitus (HCC) 09/13/2020   Elevated hemoglobin A1c 08/01/2020   Mixed hyperlipidemia 08/01/2020   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/01/2020   Lumbar disc herniation with radiculopathy 02/08/2015    PCP: Lenon Nell SAILOR,  FNP  REFERRING PROVIDER: Leigh Venetia CROME, MD  REFERRING DIAG: Spinal stenosis of lumbar region, unspecified whether neurogenic claudication present [M48.061], Cervicalgia [M54.2], Neurogenic claudication [R29.818]   Rationale for Evaluation and Treatment: Rehabilitation  THERAPY DIAG:  Difficulty in walking, not elsewhere classified  Cervicalgia  Radiculopathy, lumbar region  ONSET DATE: 01/28/2023   SUBJECTIVE:  SUBJECTIVE STATEMENT: 08/30/2023: Patient reports that she felt okay after the last visit. She continues to have the pressure in her head that feels like it is tight/constricting. She states that the pressure is worse when lying on her side or moving around. The only position that relieves this is laying supine. She states that she doesn't have any back pain at this time. Her leg only bothers her when she walks for a long time. She rates the intensity of the pressure as 10/10 today.    Eval statement: Patient reports that her primary concerns today include the pressure in her head and numbness in her leg after walking >5 minutes. She first noticed these in December 2024. She states that standing in the shower doesn't bother her, but it does when standing during meal preparation and other household activities. She has difficulty bending forward to pick something up, d/t feeling lightheaded. She did have some menstrual irregularity and amenorrhea from May 2024 to December 2024, which appears to have regulated at that time.  She states that she was previously able to walk for hours and hours.    PERTINENT HISTORY:   Relevant PMHx includes: Anemia, bipolar disorder, diabetes mellitus, HNP, HTN, menorrhagia, obesity, OCD, Tourette's disorder, dyspnea, history of lumbar laminectomy/decompression with  microdiscectomy, schizophrenia, depression    PAIN:  Are you having pain?  No  PRECAUTIONS: Fall  RED FLAGS: Cervical red flags: Dysphagia No, Dysmetria No, Diplopia Yes: I see double, sometimes its blurry; it hasn't improved with new glasses, Nystagmus No, and Nausea No, Bowel or bladder incontinence: Yes: sometimes its a little and sometimes its a lot, and Cauda equina syndrome: No    WEIGHT BEARING RESTRICTIONS: No  FALLS:  Has patient fallen in last 6 months? Yes. Number of falls 1 my leg gave out on me   LIVING ENVIRONMENT: Lives with: with stairs Lives in: House/apartment Stairs: Yes: External: 5-15 steps; can reach both Has following equipment at home: Grab bars  OCCUPATION: NA  PLOF: Independent  PATIENT GOALS: To have less pain when standing and less head pain/pressure  NEXT MD VISIT: Around August per pt report   OBJECTIVE:  Note: Objective measures were completed at Evaluation unless otherwise noted.  DIAGNOSTIC FINDINGS:  06/24/2023 MRI Lumbar Spine  IMPRESSION: Compared to 2016, mild progression of disc degeneration at L5-S1 with chronic herniation post micro discectomy on the right. Increased left paramedian herniation could affect the left S1 nerve root at the subarticular recess.   L4-5 chronic noncompressive central disc protrusion.  03/04/2023 CT Head   IMPRESSION: Compared to 2016, mild progression of disc degeneration at L5-S1 with chronic herniation post micro discectomy on the right. Increased left paramedian herniation could affect the left S1 nerve root at the subarticular recess.   L4-5 chronic noncompressive central disc protrusion.  PATIENT SURVEYS:  Modified Oswestry 30/50 = 60% disability   Most difficulty with person care activities, walking, standing, sleeping, travel, social life, and participation in household work/occupational duties  COGNITION: Overall cognitive status: Within functional limits for tasks  assessed     SENSATION: Not tested   POSTURE: rounded shoulders, forward head, and increased thoracic kyphosis  PALPATION: Patient denies pain with palpation throughout cervical spine. She does have hypomobility with CPA from C2-T1.   CERVICAL ROM: grossly WFL in all directions   Active ROM A/PROM (deg) eval  Flexion   Extension   Right lateral flexion   Left lateral flexion   Right rotation   Left rotation    (  Blank rows = not tested)   LUMBAR ROM: grossly WFL in all directions  AROM eval  Flexion   Extension   Right lateral flexion   Left lateral flexion   Right rotation   Left rotation    (Blank rows = not tested)  LOWER EXTREMITY ROM:     Active  Right eval Left eval  Hip flexion    Hip extension    Hip abduction    Hip adduction    Hip internal rotation    Hip external rotation    Knee flexion    Knee extension    Ankle dorsiflexion    Ankle plantarflexion    Ankle inversion    Ankle eversion     (Blank rows = not tested)  LOWER EXTREMITY MMT:    MMT Right eval Left eval  Hip flexion 4+ 4+  Hip extension    Hip abduction 4+ 4+  Hip adduction 5 5  Hip internal rotation    Hip external rotation    Knee flexion 4 4  Knee extension 4+ 4+  Ankle dorsiflexion 4+ 4+  Ankle plantarflexion    Ankle inversion    Ankle eversion     (Blank rows = not tested)  LUMBAR SPECIAL TESTS:  Deferred for future visits as indicated  FUNCTIONAL TESTS:  Deferred for future visits as indicated  GAIT: Distance walked: 50 feet  Assistive device utilized: None Level of assistance: Complete Independence   TREATMENT DATE:   [facility] Adult PT Treatment:                                                DATE: 08/30/2023   Therapeutic Exercise: Supine DNF endurance 4x10sec hold Supine double GH flexion AROM 1x10 cues for comfortable ROM Pressure in the right side of neck  Supine BIL GH flexion AROM with 1lb dumbbell Reports no pressure in neck with addition of  weight   Neuromuscular re-ed: Supine chin tuck 2x8 cues for reduced compensations Supine scap retraction + double ER 2x8 w/ red band cues for reduced UT compensations Hooklying marches 2x10 BIL cues for core activation, pacing, and breath control  Therapeutic Activities:  Time spent addressing current symptoms, indications for ED (loss of vision, loss of ability to walking, loss of ability to use the restroom), encouraged to go to ED if pressure remains at high intensity for further workup as mentioned in neurology office note, encouraged to schedule next neurology or ophto visit d/t persistence of symptoms   [facility] Adult PT Treatment:                                                DATE: 08/22/23 Therapeutic Exercise: Supine DNF endurance 3x10sec hold Supine double GH flexion AROM 2x10 cues for comfortable ROM HEP update + education/handout  Neuromuscular re-ed: Supine chin tuck 2x8 cues for reduced compensations Supine scap retraction + double ER x10 unresisted, x8 w/ red band cues for reduced UT compensations Hookling marches 2x10 BIL cues for core activation, pacing, and breath control   [facility] Adult PT Treatment:  DATE: 08/15/2023 Therapeutic Activity: Evaluative testing Suboccipital distraction/release: + Dix hallpike: negative B   Self Care: HEP administration and education                                                                                                                                  PATIENT EDUCATION:  Education details: rationale for interventions, HEP  Person educated: Patient Education method: Explanation, Demonstration, Tactile cues, Verbal cues Education comprehension: verbalized understanding, returned demonstration, verbal cues required, tactile cues required, and needs further education     HOME EXERCISE PROGRAM: Access Code: [email or web address] URL: [email or web address] Date: 08/22/2023 Prepared  by: Alm Jenny  Exercises - Sub-Occipital Cervical Stretch  - 1-2 x daily - 7 x weekly - 1 sets - 1 reps - 33m hold - Supine Cervical Retraction with Towel  - 1-2 x daily - 7 x weekly - 1 sets - 8-10 reps - Supine Shoulder External Rotation with Resistance  - 1-2 x daily - 7 x weekly - 1 sets - 8 reps - Supine Shoulder Flexion Extension Full Range AROM  - 1-2 x daily - 7 x weekly - 1 sets - 8-10 reps  ASSESSMENT:  CLINICAL IMPRESSION: 08/30/2023: Carolyn Ingram presented to PT today with increased intensity of pressure in her head. However, she has improved activity tolerance with supine position, and using light resistance for shoulder flexion. She reports R hip pain with LE activities. Time spent today to address current symptoms, indications for ED (loss of vision, loss of ability to walking, loss of ability to use the restroom), encouraged to go to ED if pressure remains at high intensity for further workup as mentioned in neurology office note, encouraged to schedule next neurology or ophto visit d/t persistence of symptoms.  We will continue to progress per POC as appropriate, in order to reach established rehab goals.     Eval: Carolyn Ingram is a 28 y.o. female who was seen today for physical therapy evaluation and treatment for Neck Pain with radicular symptoms and Low Back Pain with radicular symptoms. She is demonstrating decreased cervical spine mobility, decreased LE strength, decreased postural endurance. She has related pain and difficulty with prolonged standing, prolonged walking, sleeping, participation in normal social and occupation activities, heavy lifting, and performing ADLs/IADLs. She requires skilled PT services at this time to address relevant deficits and improve overall function.     OBJECTIVE IMPAIRMENTS: decreased activity tolerance, decreased endurance, decreased strength, postural dysfunction, and pain.   ACTIVITY LIMITATIONS: carrying, lifting, standing, squatting, stairs, bed  mobility, and locomotion level  PARTICIPATION LIMITATIONS: meal prep, cleaning, laundry, personal finances, driving, shopping, community activity, and occupation  PERSONAL FACTORS: Past/current experiences, Time since onset of injury/illness/exacerbation, and 3+ comorbidities: Relevant PMHx includes: Anemia, bipolar disorder, diabetes mellitus, HNP, HTN, menorrhagia, obesity, OCD, Tourette's disorder, dyspnea, history of lumbar laminectomy/decompression with microdiscectomy, schizophrenia, depression are also affecting patient's functional outcome.   REHAB POTENTIAL: Fair    CLINICAL DECISION  MAKING: Evolving/moderate complexity  EVALUATION COMPLEXITY: Moderate   GOALS: Goals reviewed with patient? YES  SHORT TERM GOALS: Target date: 08/29/2023   Patient will be independent with initial home program at least 3 days/week.  Baseline: provided at eval Goal Status: INITIAL   2.  Patient will demonstrate improved postural awareness for at least 15 minutes while seated without need for cueing from PT.  Baseline: see objective measures Goal Status: INITIAL   3.  Patient will demonstrate improved LE strength to 5/5 MMT Baseline:  Goal status: INITIAL   LONG TERM GOALS: Target date: 09/26/2023    Patient will report improved overall functional ability with ODI score of 15 or less.  Baseline: 30 Goal Status: INITIAL   2.  Patient will demonstrate ability to tolerate weight bearing activities for at least 30 minutes. Baseline: unable to tolerate >10 min  Goal status: INITIAL  3.  Patient will demonstrate ability to perform floor to waist lifting of at least 20# using appropriate body mechanics and with no more than minimal pain in order to safely perform normal daily/occupational tasks.   Baseline: limited with <10lb   Goal Status: INITIAL   4.  Patient will report decreased frequency of neck and head pain/pressure to no more than 3x/week. Baseline: daily  Goal status:  INITIAL    PLAN:  PT FREQUENCY: 2x/week  PT DURATION: 8 weeks  PLANNED INTERVENTIONS: 97164- PT Re-evaluation, 97750- Physical Performance Testing, 97110-Therapeutic exercises, 97530- Therapeutic activity, 97112- Neuromuscular re-education, 97535- Self Care, [id number]- Manual therapy, [phone]- Aquatic Therapy, [id number]- Electrical stimulation (unattended), Patient/Family education, Balance training, Joint mobilization, Spinal mobilization, Cryotherapy, and Moist heat.  PLAN FOR NEXT SESSION:  further assess and tx balance,  perform functional tests if indicated, address CS mobility and LE strength; otherwise progress towards established rehab goals    Marko Molt, PT, DPT  08/30/2023 10:03 AM

## 2023-09-05 ENCOUNTER — Ambulatory Visit

## 2023-09-05 DIAGNOSIS — R262 Difficulty in walking, not elsewhere classified: Secondary | ICD-10-CM | POA: Diagnosis not present

## 2023-09-05 DIAGNOSIS — M5416 Radiculopathy, lumbar region: Secondary | ICD-10-CM

## 2023-09-05 DIAGNOSIS — M542 Cervicalgia: Secondary | ICD-10-CM

## 2023-09-05 NOTE — Therapy (Signed)
 OUTPATIENT PHYSICAL THERAPY TREATMENT   Patient Name: Carolyn Ingram MRN: [id number] DOB:1995-04-13, 28 y.o., female Today's Date: 09/05/2023  END OF SESSION:  PT End of Session - 09/05/23 1006     Visit Number 5    Number of Visits 17    Date for PT Re-Evaluation 09/26/23    Authorization Type [employer] MCD/MCD    PT Start Time 1002    PT Stop Time 1040    PT Time Calculation (min) 38 min    Activity Tolerance Patient tolerated treatment well    Behavior During Therapy WFL for tasks assessed/performed              Past Medical History:  Diagnosis Date   Anemia    Bipolar disorder (HCC)    Dental caries    Diabetes mellitus without complication (HCC)    Headache    HNP (herniated nucleus pulposus)    Hypertension    Impacted teeth    wisdom   Menorrhagia    Morbid obesity with BMI of 50.0-59.9, adult (HCC)    Numbness and tingling of leg    right   OCD (obsessive compulsive disorder)    Shortness of breath dyspnea    Tourette's disorder    Wears glasses    Past Surgical History:  Procedure Laterality Date   LUMBAR LAMINECTOMY/DECOMPRESSION MICRODISCECTOMY Right 02/08/2015   Procedure: Microdiscectomy - right - L5-S1 ;  Surgeon: Victory Gunnels, MD;  Location: [facility];  Service: Neurosurgery;  Laterality: Right;  Microdiscectomy - right - L5-S1    TOOTH EXTRACTION N/A 04/27/2017   Procedure: DENTAL RESTORATION/EXTRACTIONS;  Surgeon: Sheryle Hamilton, DDS;  Location: [facility];  Service: Oral Surgery;  Laterality: N/A;   Patient Active Problem List   Diagnosis Date Noted   Hemoglobin A1C greater than 9%, indicating poor diabetic control 06/07/2022   Moderate recurrent major depression (HCC) 09/27/2021   Schizophrenia (HCC) 07/05/2021   Type II diabetes mellitus (HCC) 09/13/2020   Elevated hemoglobin A1c 08/01/2020   Mixed hyperlipidemia 08/01/2020   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/01/2020   Lumbar disc herniation with radiculopathy 02/08/2015    PCP: Lenon Nell SAILOR,  FNP  REFERRING PROVIDER: Leigh Venetia CROME, MD  REFERRING DIAG: Spinal stenosis of lumbar region, unspecified whether neurogenic claudication present [M48.061], Cervicalgia [M54.2], Neurogenic claudication [R29.818]   Rationale for Evaluation and Treatment: Rehabilitation  THERAPY DIAG:  Difficulty in walking, not elsewhere classified  Cervicalgia  Radiculopathy, lumbar region  ONSET DATE: 01/28/2023   SUBJECTIVE:  SUBJECTIVE STATEMENT: 09/05/2023: Patient reporting that she feels okay today, no significant changes in her symptoms. She would like to be able to work towards walking again (at least 1 hour).    Eval statement: Patient reports that her primary concerns today include the pressure in her head and numbness in her leg after walking >5 minutes. She first noticed these in December 2024. She states that standing in the shower doesn't bother her, but it does when standing during meal preparation and other household activities. She has difficulty bending forward to pick something up, d/t feeling lightheaded. She did have some menstrual irregularity and amenorrhea from May 2024 to December 2024, which appears to have regulated at that time.  She states that she was previously able to walk for hours and hours.    PERTINENT HISTORY:   Relevant PMHx includes: Anemia, bipolar disorder, diabetes mellitus, HNP, HTN, menorrhagia, obesity, OCD, Tourette's disorder, dyspnea, history of lumbar laminectomy/decompression with microdiscectomy, schizophrenia, depression    PAIN:  Are you having pain?  No  PRECAUTIONS: Fall  RED FLAGS: Cervical red flags: Dysphagia No, Dysmetria No, Diplopia Yes: I see double, sometimes its blurry; it hasn't improved with new glasses, Nystagmus No, and Nausea No, Bowel or  bladder incontinence: Yes: sometimes its a little and sometimes its a lot, and Cauda equina syndrome: No    WEIGHT BEARING RESTRICTIONS: No  FALLS:  Has patient fallen in last 6 months? Yes. Number of falls 1 my leg gave out on me   LIVING ENVIRONMENT: Lives with: with sister Lives in: House/apartment Stairs: Yes: External: 5-15 steps; can reach both Has following equipment at home: Grab bars  OCCUPATION: NA  PLOF: Independent  PATIENT GOALS: To have less pain when standing and less head pain/pressure  NEXT MD VISIT: Around August per pt report   OBJECTIVE:  Note: Objective measures were completed at Evaluation unless otherwise noted.  DIAGNOSTIC FINDINGS:  06/24/2023 MRI Lumbar Spine  IMPRESSION: Compared to 2016, mild progression of disc degeneration at L5-S1 with chronic herniation post micro discectomy on the right. Increased left paramedian herniation could affect the left S1 nerve root at the subarticular recess.   L4-5 chronic noncompressive central disc protrusion.  03/04/2023 CT Head   IMPRESSION: Compared to 2016, mild progression of disc degeneration at L5-S1 with chronic herniation post micro discectomy on the right. Increased left paramedian herniation could affect the left S1 nerve root at the subarticular recess.   L4-5 chronic noncompressive central disc protrusion.  PATIENT SURVEYS:  Modified Oswestry 30/50 = 60% disability   Most difficulty with person care activities, walking, standing, sleeping, travel, social life, and participation in household work/occupational duties  COGNITION: Overall cognitive status: Within functional limits for tasks assessed     SENSATION: Not tested   POSTURE: rounded shoulders, forward head, and increased thoracic kyphosis  PALPATION: Patient denies pain with palpation throughout cervical spine. She does have hypomobility with CPA from C2-T1.   CERVICAL ROM: grossly WFL in all directions   Active ROM  A/PROM (deg) eval  Flexion   Extension   Right lateral flexion   Left lateral flexion   Right rotation   Left rotation    (Blank rows = not tested)   LUMBAR ROM: grossly WFL in all directions  AROM eval  Flexion   Extension   Right lateral flexion   Left lateral flexion   Right rotation   Left rotation    (Blank rows = not tested)  LOWER EXTREMITY ROM:  Active  Right eval Left eval  Hip flexion    Hip extension    Hip abduction    Hip adduction    Hip internal rotation    Hip external rotation    Knee flexion    Knee extension    Ankle dorsiflexion    Ankle plantarflexion    Ankle inversion    Ankle eversion     (Blank rows = not tested)  LOWER EXTREMITY MMT:    MMT Right eval Left eval  Hip flexion 4+ 4+  Hip extension    Hip abduction 4+ 4+  Hip adduction 5 5  Hip internal rotation    Hip external rotation    Knee flexion 4 4  Knee extension 4+ 4+  Ankle dorsiflexion 4+ 4+  Ankle plantarflexion    Ankle inversion    Ankle eversion     (Blank rows = not tested)  LUMBAR SPECIAL TESTS:  Deferred for future visits as indicated  FUNCTIONAL TESTS:  Deferred for future visits as indicated  GAIT: Distance walked: 50 feet  Assistive device utilized: None Level of assistance: Complete Independence   TREATMENT DATE:   [facility] Adult PT Treatment:                                                DATE: 09/05/2023  Neuromuscular re-ed: Supine DNF endurance 4x10sec hold Supine double GH flexion AROM 2x10 cues for comfortable ROM Supine BIL chest press with 1lb dumbbell Supine horizontal abduction with red TB 2 x 8  Supine pull downs green TB with handles, 2 x 18 (held by clinician)  Abd bracing + pball press down x 15 hooklying (3 sec hold)  Abd bracing + pball press down x 15 seated (3 sec hold)  Supine chin tuck 2x8, 3 sec hold with cues for reduced compensations Supine scap retraction + double ER 2x8 w/ red band cues for reduced UT  compensations Hooklying marches 2x15 BIL, red TB, cues for core activation, pacing, and breath control Hooklying knee fall outs, 2 x 10 each side (alternating sets)     [facility] Adult PT Treatment:                                                DATE: 08/30/2023   Therapeutic Exercise: Supine DNF endurance 4x10sec hold Supine double GH flexion AROM 1x10 cues for comfortable ROM Pressure in the right side of neck  Supine BIL GH flexion AROM with 1lb dumbbell Reports no pressure in neck with addition of weight   Neuromuscular re-ed: Supine chin tuck 2x8 cues for reduced compensations Supine scap retraction + double ER 2x8 w/ red band cues for reduced UT compensations Hooklying marches 2x10 BIL cues for core activation, pacing, and breath control  Therapeutic Activities:  Time spent addressing current symptoms, indications for ED (loss of vision, loss of ability to walking, loss of ability to use the restroom), encouraged to go to ED if pressure remains at high intensity for further workup as mentioned in neurology office note, encouraged to schedule next neurology or ophto visit d/t persistence of symptoms   [facility] Adult PT Treatment:  DATE: 08/22/23 Therapeutic Exercise: Supine DNF endurance 3x10sec hold Supine double GH flexion AROM 2x10 cues for comfortable ROM HEP update + education/handout  Neuromuscular re-ed: Supine chin tuck 2x8 cues for reduced compensations Supine scap retraction + double ER x10 unresisted, x8 w/ red band cues for reduced UT compensations Hookling marches 2x10 BIL cues for core activation, pacing, and breath control   [facility] Adult PT Treatment:                                                DATE: 08/15/2023 Therapeutic Activity: Evaluative testing Suboccipital distraction/release: + Dix hallpike: negative B   Self Care: HEP administration and education                                                                                                                                   PATIENT EDUCATION:  Education details: rationale for interventions, HEP  Person educated: Patient Education method: Explanation, Demonstration, Tactile cues, Verbal cues Education comprehension: verbalized understanding, returned demonstration, verbal cues required, tactile cues required, and needs further education     HOME EXERCISE PROGRAM: Access Code: [email or web address] URL: [email or web address] Date: 08/22/2023 Prepared by: Alm Jenny  Exercises - Sub-Occipital Cervical Stretch  - 1-2 x daily - 7 x weekly - 1 sets - 1 reps - 22m hold - Supine Cervical Retraction with Towel  - 1-2 x daily - 7 x weekly - 1 sets - 8-10 reps - Supine Shoulder External Rotation with Resistance  - 1-2 x daily - 7 x weekly - 1 sets - 8 reps - Supine Shoulder Flexion Extension Full Range AROM  - 1-2 x daily - 7 x weekly - 1 sets - 8-10 reps  ASSESSMENT:  CLINICAL IMPRESSION: 09/05/2023: Jennae had good tolerance of today's treatment session, which focused on progression of core and upper quarter activities. Patient reports no pressure in head/neck with UQ program, however this begun to return following seated abdominal bracing activity at end of session. We will continue to progress per POC as tolerated, in order to reach established rehab goals.     Eval: Carolyn Ingram is a 28 y.o. female who was seen today for physical therapy evaluation and treatment for Neck Pain with radicular symptoms and Low Back Pain with radicular symptoms. She is demonstrating decreased cervical spine mobility, decreased LE strength, decreased postural endurance. She has related pain and difficulty with prolonged standing, prolonged walking, sleeping, participation in normal social and occupation activities, heavy lifting, and performing ADLs/IADLs. She requires skilled PT services at this time to address relevant deficits and improve overall function.     OBJECTIVE  IMPAIRMENTS: decreased activity tolerance, decreased endurance, decreased strength, postural dysfunction, and pain.   ACTIVITY LIMITATIONS: carrying, lifting, standing, squatting, stairs, bed mobility, and locomotion level  PARTICIPATION LIMITATIONS: meal prep, cleaning,  laundry, personal finances, driving, shopping, community activity, and occupation  PERSONAL FACTORS: Past/current experiences, Time since onset of injury/illness/exacerbation, and 3+ comorbidities: Relevant PMHx includes: Anemia, bipolar disorder, diabetes mellitus, HNP, HTN, menorrhagia, obesity, OCD, Tourette's disorder, dyspnea, history of lumbar laminectomy/decompression with microdiscectomy, schizophrenia, depression are also affecting patient's functional outcome.   REHAB POTENTIAL: Fair    CLINICAL DECISION MAKING: Evolving/moderate complexity  EVALUATION COMPLEXITY: Moderate   GOALS: Goals reviewed with patient? YES  SHORT TERM GOALS: Target date: 08/29/2023   Patient will be independent with initial home program at least 3 days/week.  Baseline: provided at eval Goal Status: INITIAL   2.  Patient will demonstrate improved postural awareness for at least 15 minutes while seated without need for cueing from PT.  Baseline: see objective measures Goal Status: INITIAL   3.  Patient will demonstrate improved LE strength to 5/5 MMT Baseline:  Goal status: INITIAL   LONG TERM GOALS: Target date: 09/26/2023    Patient will report improved overall functional ability with ODI score of 15 or less.  Baseline: 30 Goal Status: INITIAL   2.  Patient will demonstrate ability to tolerate weight bearing activities for at least 30 minutes. Baseline: unable to tolerate >10 min  Goal status: INITIAL  3.  Patient will demonstrate ability to perform floor to waist lifting of at least 20# using appropriate body mechanics and with no more than minimal pain in order to safely perform normal daily/occupational tasks.    Baseline: limited with <10lb   Goal Status: INITIAL   4.  Patient will report decreased frequency of neck and head pain/pressure to no more than 3x/week. Baseline: daily  Goal status: INITIAL    PLAN:  PT FREQUENCY: 2x/week  PT DURATION: 8 weeks  PLANNED INTERVENTIONS: 97164- PT Re-evaluation, 97750- Physical Performance Testing, 97110-Therapeutic exercises, 97530- Therapeutic activity, 97112- Neuromuscular re-education, 97535- Self Care, [id number]- Manual therapy, [phone]- Aquatic Therapy, [id number]- Electrical stimulation (unattended), Patient/Family education, Balance training, Joint mobilization, Spinal mobilization, Cryotherapy, and Moist heat.  PLAN FOR NEXT SESSION:  further assess and tx balance,  perform functional tests if indicated, address CS mobility and LE strength; otherwise progress towards established rehab goals    Marko Molt, PT, DPT  09/05/2023 10:48 AM

## 2023-09-13 ENCOUNTER — Ambulatory Visit: Admitting: Physical Therapy

## 2023-09-13 ENCOUNTER — Encounter: Payer: Self-pay | Admitting: Physical Therapy

## 2023-09-13 DIAGNOSIS — R262 Difficulty in walking, not elsewhere classified: Secondary | ICD-10-CM

## 2023-09-13 DIAGNOSIS — M542 Cervicalgia: Secondary | ICD-10-CM

## 2023-09-13 NOTE — Therapy (Addendum)
 OUTPATIENT PHYSICAL THERAPY TREATMENT + DISCHARGE   Patient Name: Carolyn Ingram MRN: [id number] DOB:[birth year], 28 y.o., female Today's Date: 09/13/2023  PHYSICAL THERAPY DISCHARGE SUMMARY  Visits from Start of Care: 6  Current functional level related to goals / functional outcomes: Pt denies any change in symptoms/function since start of PT POC   Remaining deficits: Pressure, reduced activity tolerance   Education / Equipment: HEP, discharge education, follow up with provider, monitoring symptoms/red flags; see below for details   Patient agrees to discharge. Patient goals were not met. Patient is being discharged due to lack of progress.   END OF SESSION:  PT End of Session - 09/13/23 0856     Visit Number 6    Number of Visits 17    Date for PT Re-Evaluation 09/26/23    Authorization Type [employer] MCD/MCD    PT Start Time 0900    PT Stop Time 0930    PT Time Calculation (min) 30 min    Activity Tolerance Patient tolerated treatment well            Past Medical History:  Diagnosis Date   Anemia    Bipolar disorder (HCC)    Dental caries    Diabetes mellitus without complication (HCC)    Headache    HNP (herniated nucleus pulposus)    Hypertension    Impacted teeth    wisdom   Menorrhagia    Morbid obesity with BMI of 50.0-59.9, adult (HCC)    Numbness and tingling of leg    right   OCD (obsessive compulsive disorder)    Shortness of breath dyspnea    Tourette's disorder    Wears glasses    Past Surgical History:  Procedure Laterality Date   LUMBAR LAMINECTOMY/DECOMPRESSION MICRODISCECTOMY Right 02/08/2015   Procedure: Microdiscectomy - right - L5-S1 ;  Surgeon: Victory Gunnels, MD;  Location: [facility];  Service: Neurosurgery;  Laterality: Right;  Microdiscectomy - right - L5-S1    TOOTH EXTRACTION N/A 04/27/2017   Procedure: DENTAL RESTORATION/EXTRACTIONS;  Surgeon: Sheryle Hamilton, DDS;  Location: [facility];  Service: Oral Surgery;  Laterality: N/A;    Patient Active Problem List   Diagnosis Date Noted   Hemoglobin A1C greater than 9%, indicating poor diabetic control 06/07/2022   Moderate recurrent major depression (HCC) 09/27/2021   Schizophrenia (HCC) 07/05/2021   Type II diabetes mellitus (HCC) 09/13/2020   Elevated hemoglobin A1c 08/01/2020   Mixed hyperlipidemia 08/01/2020   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 Continues to describe 10/10 pressure in upright positioning. Completely resolves in supine. States HEP feels helpful from muscular perspective but doesn't really affect her symptoms much. Denies any new symptoms or worsening of symptoms.   Eval statement: Patient reports that her primary concerns today include the pressure in her head and numbness in her leg after walking >5 minutes. She first noticed these in December 2024. She states that standing in the shower doesn't bother her, but it does when standing during meal preparation and other household activities. She has difficulty bending forward to pick something up, d/t feeling lightheaded. She did have some menstrual irregularity and amenorrhea from May 2024 to December 2024, which appears to have regulated at that time.  She states that she  was previously able to walk for hours and hours.    PERTINENT HISTORY:   Relevant PMHx includes: Anemia, bipolar disorder, diabetes mellitus, HNP, HTN, menorrhagia, obesity, OCD, Tourette's disorder, dyspnea, history of lumbar laminectomy/decompression with microdiscectomy, schizophrenia, depression    PAIN:  Are you having pain?  No  PRECAUTIONS: Fall  RED FLAGS: Cervical red flags: Dysphagia No, Dysmetria No, Diplopia Yes: I see double, sometimes its blurry; it hasn't improved with new glasses, Nystagmus No, and Nausea No, Bowel or bladder incontinence: Yes: sometimes its a little and sometimes its a lot, and Cauda equina syndrome: No    WEIGHT BEARING RESTRICTIONS: No  FALLS:  Has patient fallen in last 6 months? Yes. Number of falls 1 my leg gave out on me   LIVING ENVIRONMENT: Lives with: with sister Lives in: House/apartment Stairs: Yes: External: 5-15 steps; can reach both Has following equipment at home: Grab bars  OCCUPATION: NA  PLOF: Independent  PATIENT GOALS: To have less pain when standing and less head pain/pressure  NEXT MD VISIT: Around August per pt report   OBJECTIVE:  Note: Objective measures were completed at Evaluation unless otherwise noted.  DIAGNOSTIC FINDINGS:  06/24/2023 MRI Lumbar Spine  IMPRESSION: Compared to 2016, mild progression of disc degeneration at L5-S1 with chronic herniation post micro discectomy on the right. Increased left paramedian herniation could affect the left S1 nerve root at the subarticular recess.   L4-5 chronic noncompressive central disc protrusion.  03/04/2023 CT Head   IMPRESSION: Compared to 2016, mild progression of disc degeneration at L5-S1 with chronic herniation post micro discectomy on the right. Increased left paramedian herniation could affect the left S1 nerve root at the subarticular recess.   L4-5 chronic noncompressive central disc protrusion.  PATIENT SURVEYS:  Modified Oswestry  30/50 = 60% disability   Most difficulty with person care activities, walking, standing, sleeping, travel, social life, and participation in household work/occupational duties  09/13/23 mod oswestry 17/50  COGNITION: Overall cognitive status: Within functional limits for tasks assessed     SENSATION: Not tested   POSTURE: rounded shoulders, forward head, and increased thoracic kyphosis  PALPATION: Patient denies pain with palpation throughout cervical spine. She does have hypomobility with CPA from C2-T1.   CERVICAL ROM: grossly WFL in all directions   Active ROM A/PROM (deg) eval  Flexion   Extension   Right lateral flexion   Left lateral flexion   Right rotation   Left rotation    (Blank rows = not tested)   LUMBAR ROM: grossly WFL in all directions  AROM eval  Flexion   Extension   Right lateral flexion   Left lateral flexion   Right  rotation   Left rotation    (Blank rows = not tested)  LOWER EXTREMITY ROM:     Active  Right eval Left eval  Hip flexion    Hip extension    Hip abduction    Hip adduction    Hip internal rotation    Hip external rotation    Knee flexion    Knee extension    Ankle dorsiflexion    Ankle plantarflexion    Ankle inversion    Ankle eversion     (Blank rows = not tested)  LOWER EXTREMITY MMT:    MMT Right eval Left eval R/L 09/13/23    Hip flexion 4+ 4+ 4+/4+  Hip extension     Hip abduction 4+ 4+   Hip adduction 5 5   Hip internal rotation     Hip external rotation     Knee flexion 4 4 4+/4+  Knee extension 4+ 4+ 4+/4+  Ankle dorsiflexion 4+ 4+ 5/5  Ankle plantarflexion     Ankle inversion     Ankle eversion      (Blank rows = not tested)  LUMBAR SPECIAL TESTS:  Deferred for future visits as indicated  FUNCTIONAL TESTS:  Deferred for future visits as indicated  GAIT: Distance walked: 50 feet  Assistive device utilized: None Level of assistance: Complete Independence   TREATMENT DATE:   [facility] Adult  PT Treatment:                                                DATE: 09/13/23 Therapeutic Exercise: Supine shoulder flexion AROM x12 Supine double ER blue band x12 Supine cervical retraction x12 Seated self occipital stretch  HEP handout + education  Therapeutic Activity: MSK assessment + education Education/discussion re: progress with PT, symptom behavior as it affects activity tolerance, PT goals/POC, discharge education, follow up with provider, education on monitoring for red flags or sudden changes in vision and indications for ED/EMS                                                                                                                                PATIENT EDUCATION:  Education details: PT POC, PT goals, progress with PT thus far, discharge planning, HEP, follow up with provider  Person educated: Patient Education method: Explanation, Demonstration, Verbal cues Education comprehension: verbalized understanding, returned demonstration   HOME EXERCISE PROGRAM: Access Code: [email or web address]   ASSESSMENT:  CLINICAL IMPRESSION: 09/13/2023: Pt arrives w/ report of no change in symptoms since starting PT. On exam she does demonstrate modest improvements in LE MMT and subjectively she states exercises have been beneficial from muscular perspective, but does not feel it has affected her symptoms or tolerance. Positional changes remain primary provocative factor, reporting resolution of pressure in supine. At this point, given lack of change  in symptoms and report of continued neurological issues (I.e word finding issues, forgetfulness), recommend discharge to independent HEP and additional neuro follow up. Continued education is provided on close monitoring and indications for ED/EMS. She states she plans to reach out to neurologist to schedule follow up. Pt verbalizes agreement/understanding with plan at this time. No adverse events, tolerates exam/HEP well without any increase in symptoms.  Pt departs today's session in no acute distress, all voiced questions/concerns addressed appropriately from PT perspective.     Eval: Marzella is a 28 y.o. female who was seen today for physical therapy evaluation and treatment for Neck Pain with radicular symptoms and Low Back Pain with radicular symptoms. She is demonstrating decreased cervical spine mobility, decreased LE strength, decreased postural endurance. She has related pain and difficulty with prolonged standing, prolonged walking, sleeping, participation in normal social and occupation activities, heavy lifting, and performing ADLs/IADLs. She requires skilled PT services at this time to address relevant deficits and improve overall function.     OBJECTIVE IMPAIRMENTS: decreased activity tolerance, decreased endurance, decreased strength, postural dysfunction, and pain.   ACTIVITY LIMITATIONS: carrying, lifting, standing, squatting, stairs, bed mobility, and locomotion level  PARTICIPATION LIMITATIONS: meal prep, cleaning, laundry, personal finances, driving, shopping, community activity, and occupation  PERSONAL FACTORS: Past/current experiences, Time since onset of injury/illness/exacerbation, and 3+ comorbidities: Relevant PMHx includes: Anemia, bipolar disorder, diabetes mellitus, HNP, HTN, menorrhagia, obesity, OCD, Tourette's disorder, dyspnea, history of lumbar laminectomy/decompression with microdiscectomy, schizophrenia, depression are also affecting patient's functional outcome.   REHAB POTENTIAL: Fair    CLINICAL DECISION MAKING: Evolving/moderate complexity  EVALUATION COMPLEXITY: Moderate   GOALS: Goals reviewed with patient? YES  SHORT TERM GOALS: Target date: 08/29/2023   Patient will be independent with initial home program at least 3 days/week.  Baseline: provided at eval 09/13/23: reports good adherence w/ HEP Goal Status: MET  2.  Patient will demonstrate improved postural awareness for at least 15 minutes  while seated without need for cueing from PT.  Baseline: see objective measures 09/13/23: demonstrates fair posture, but limited in tolerance d/t pressure Goal Status: NOT MET  3.  Patient will demonstrate improved LE strength to 5/5 MMT Baseline:  09/13/23: see MMT chart above Goal Status: NOT MET   LONG TERM GOALS: Target date: 09/26/2023    Patient will report improved overall functional ability with ODI score of 15 or less.  Baseline: 30 09/13/23: 17/50 Goal Status: NOT MET  2.  Patient will demonstrate ability to tolerate weight bearing activities for at least 30 minutes. Baseline: unable to tolerate >10 min  09/13/23: <60min tolerance reported Goal Status: NOT MET  3.  Patient will demonstrate ability to perform floor to waist lifting of at least 20# using appropriate body mechanics and with no more than minimal pain in order to safely perform normal daily/occupational tasks.   Baseline: limited with <10lb  09/13/23: NT Goal Status: NOT MET  4.  Patient will report decreased frequency of neck and head pain/pressure to no more than 3x/week. Baseline: daily  09/13/23: daily Goal Status: NOT MET    PLAN: DISCHARGE 09/13/23  Alm DELENA Jenny PT, DPT 09/13/2023 9:41 AM   Addendum for assessment: Alm DELENA Jenny PT, DPT 09/13/2023 9:42 AM

## 2023-11-09 ENCOUNTER — Other Ambulatory Visit: Payer: Self-pay | Admitting: Medical Genetics

## 2023-11-14 ENCOUNTER — Telehealth: Payer: Self-pay | Admitting: Neurology

## 2023-11-14 NOTE — Telephone Encounter (Signed)
 Left a message with the after hour service on 11-14-23   Caller needs to get a order for spinal tap  please call

## 2023-11-15 ENCOUNTER — Other Ambulatory Visit: Payer: Self-pay

## 2023-11-15 DIAGNOSIS — M48061 Spinal stenosis, lumbar region without neurogenic claudication: Secondary | ICD-10-CM

## 2023-11-15 DIAGNOSIS — R519 Headache, unspecified: Secondary | ICD-10-CM

## 2023-11-15 DIAGNOSIS — R29818 Other symptoms and signs involving the nervous system: Secondary | ICD-10-CM

## 2023-11-15 DIAGNOSIS — H538 Other visual disturbances: Secondary | ICD-10-CM

## 2023-11-16 ENCOUNTER — Telehealth: Payer: Self-pay | Admitting: Neurology

## 2023-11-16 NOTE — Telephone Encounter (Signed)
 Dr.Anderson called and LM with AN. She is the pt's primary care and she needs a copy of notes when she saw Dr.Hill  Fax number is [phone]

## 2023-11-16 NOTE — Telephone Encounter (Signed)
 Patient is calling to f/u on this spinal tap that she wanted ordered

## 2023-11-19 NOTE — Telephone Encounter (Signed)
 Fax Dr. Venus notes 11/19/2023

## 2023-11-19 NOTE — Telephone Encounter (Signed)
 Called pt she wanted to know about where the procedure was to take place and will they call or will we call for an appointment. All questions were answered

## 2023-11-21 ENCOUNTER — Other Ambulatory Visit

## 2023-12-06 ENCOUNTER — Other Ambulatory Visit

## 2023-12-06 DIAGNOSIS — Z006 Encounter for examination for normal comparison and control in clinical research program: Secondary | ICD-10-CM

## 2023-12-10 ENCOUNTER — Ambulatory Visit
Admission: RE | Admit: 2023-12-10 | Discharge: 2023-12-10 | Disposition: A | Source: Ambulatory Visit | Attending: Neurology

## 2023-12-10 VITALS — BP 123/70 | HR 80

## 2023-12-10 DIAGNOSIS — H538 Other visual disturbances: Secondary | ICD-10-CM

## 2023-12-10 DIAGNOSIS — R29818 Other symptoms and signs involving the nervous system: Secondary | ICD-10-CM

## 2023-12-10 DIAGNOSIS — R519 Headache, unspecified: Secondary | ICD-10-CM

## 2023-12-10 DIAGNOSIS — M48061 Spinal stenosis, lumbar region without neurogenic claudication: Secondary | ICD-10-CM

## 2023-12-10 LAB — CSF CELL COUNT WITH DIFFERENTIAL
RBC Count, CSF: 1 {cells}/uL — ABNORMAL HIGH
TOTAL NUCLEATED CELL: 1 {cells}/uL (ref 0–5)

## 2023-12-10 LAB — GLUCOSE, CSF: Glucose, CSF: 70 mg/dL (ref 40–80)

## 2023-12-10 LAB — PROTEIN, CSF: Total Protein, CSF: 51 mg/dL — ABNORMAL HIGH (ref 15–45)

## 2023-12-10 NOTE — Discharge Instructions (Signed)

## 2023-12-11 ENCOUNTER — Telehealth: Payer: Self-pay | Admitting: Neurology

## 2023-12-11 NOTE — Telephone Encounter (Signed)
 Called patient to discuss the results of her lumbar puncture. Her opening pressure was 21 cm of water. Her routine analysis was unremarkable. She did not notice a difference after her lumbar puncture.  Her symptoms are better on her back and worse on side or stomach. Patient is still having headaches. It is more when she is moving and walking and moving her neck. She is still having neck pain. It goes away when she lays on her back. She has not noticed any difference on topamax  (currently taking 50 mg daily).  She is also still having leg pain and numbness.  She went to physical therapy and did feel this helped. She said her PT was willing to do more with another (with [facility]).  We agreed to follow up soon for me to re-examine her and discuss next steps since I last saw her in 06/2023. She will follow up with me on 12/20/23 at 11:30 am.  All questions were answered.  Venetia Potters, MD [facility]

## 2023-12-12 NOTE — Progress Notes (Signed)
 NEUROLOGY FOLLOW UP OFFICE NOTE  Carolyn Ingram [id number]  Subjective:  Carolyn Ingram is a 28 y.o. year old female with a history of lumbar spondylosis s/p surgery (2016), DM, HLD, HTN, depression who we last saw on 07/04/23 for head pressure and pain in left leg.  To briefly review: 05/17/23: When patient moves her eyes or head, she feels pressure. There is pain in the neck as well. The pressure is mostly in the back of her head. She rates the pain 7-8/10. It does not throb like her head does when she has high blood pressure. This has been present since around 12/2022. It is a tight sensation when moving eyes or neck. She also endorses blurry vision with 2 episodes of double vision. This is progressively getting worse. She does not think it matters whether she is laying down or not, but she does think there is more pressure when bending over and less when standing back up. She does not take anything for this.   Patient also has numbness in her right leg. This occurs when standing for more than 5 minutes or trying to walk. She has to use a riding cart when going to the store. When she sits and rests, this goes away. She endorses pain in the left leg more at rest, lower leg to foot. It also hurts to touch like she hit her leg on something, but she didn't (stinging pain and numbness). She has difficulty lifting the left leg at times.    Patient started having bowel incontinence in 03/2023. She also could not urinate when symptoms started, but then this resolved. She has started to have to use depends and put pads in bed at night. She denies saddle anesthesia.   CT head on 03/04/23 was normal.    She is having difficulty sleeping due to various pains mentioned above.   Of note, patient had lumbar spine surgery in 2016 for radiculopathy. Her current symptoms are different then the previous symptoms though. She has not had spinal imaging since surgery in 2016.   EtOH use: none She is a never  smoker. Restrictive diet: no Family history of neurologic disease: mother with nerve issues  07/04/23: Labs were significant for HbA1c of 8.6 (was 13.4 six years prior), but otherwise normal.   MRI lumbar spine w/wo contrast showed normal conus and caudia equina. There was some progression (mild) of disc degeneration at L5-S1 that may affect left S1 root as well. I spoke to patient on phone on 06/25/23 about the results. Per my phone note: I spoke to patient on phone about MRI lumbar spine. There was some mild to moderate stenosis but no pressure on conus or caudia equina. She reports her right leg has improved some as has her bowel and bladder symptoms. 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 She has not heard from PT.   Regarding her head pressure, this continues to be an issue for patient. She denies any vision loss. She is to see [facility] exam, but has not scheduled this yet. I recommended she do so ASAP. 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   She is still improved from prior. She can stand and walk a little longer now. She can still only stand about 10 minutes.   Regarding her head pressure, she still has this. She denies vision loss, but when she stands, light can  become bright. She will go into a dark room. She also endorses hearing a whooshing sound in her head as well. Her left side of head seems to be worse than right. She feels like someone is pushing on the back of her head. She feels it more when she is up and moving, not in the morning when she wakes up though. She started Topamax  25 mg but has not noticed a difference in symptoms. She has no clear side effects from the medication.   She is scheduled to see [facility] care but this is not until 07/2023.   She is starting PT next week (07/11/23).   Most recent Assessment and Plan (07/04/23): This is Carolyn Ingram, a 28 y.o. female  with: Leg numbness and tingling with inability to stand for longer than 10 minutes. Initially was concerned for conus or cauda equina compression given that she reported bowel/bladder incontinence. This and her right leg have improved some though. Her MRI lumbar spine showed some mild progression of known lumbar spine disease without conus or cauda equina compression. 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 Head pressure - concerning for IIH, though does not match perfectly. She was started on Topamax  25 mg about 2 weeks ago. She has not noticed a change in symptoms yet. Patient would like to try to avoid LP if possible. Given that I do not see clear papilledema today, this is reasonable. We discussed that LP may be necessary pending how she responds and what ophtho sees.   Plan: -Increase Topamax  to 50 mg daily -Start PT as planned next week for neck and back pain -See ophtho as planned -Agree with patient seeing psychology for anxiety  Since their last visit: Patient decided she wanted LP, so this was done on 12/10/23. Opening pressure was 21 cm of water with 15 mL of CSF collected. Routine analysis was unremarkable. Patient did not notice a change in headaches or vision after LP. Per my phone note from 12/11/23: Her symptoms are better on her back and worse on side or stomach. Patient is still having headaches. It is more when she is moving and walking and moving her neck. She is still having neck pain. It goes away when she lays on her back. She has not noticed any difference on topamax  (currently taking 50 mg daily).   She is also still having leg pain and numbness.   She went to physical therapy and did feel this helped. She said her PT was willing to do more with another (with [facility]).   We agreed to follow up soon for me to re-examine her and discuss next steps since I last saw her in 06/2023.  She continues to have head pressure, but only when standing up or  sitting up and this improves with laying down. She continues to take Topamax . She does not think this has changed her symptoms to the positive or negative. She has heaviness on the right side of face that comes and goes. The pressure in her head does not change unless she lays on her back.  She continues to have photophobia. She denies photophobia, nausea, or vomiting. She endorses blurry vision without her glasses, but denies vision loss. She has an appointment to see [facility], but not until 03/2024.  She went to PT for her neck and back. While this helped her neck, but when she sat back up, she had an increase in her pressure. Her left leg continues  to hurt and feel tight when standing. This is similar to prior.  MEDICATIONS:  Outpatient Encounter Medications as of 12/20/2023  Medication Sig   amLODipine (NORVASC) 10 MG tablet Take 10 mg by mouth daily.   atorvastatin (LIPITOR) 40 MG tablet Take 1 tablet by mouth daily.   Blood Glucose Monitoring Suppl (ACCU-CHEK GUIDE ME) w/Device KIT    FLUoxetine HCl 60 MG TABS Take 60 mg by mouth daily.   Lurasidone HCl 60 MG TABS Take 1 tablet by mouth daily.   metFORMIN (GLUCOPHAGE) 1000 MG tablet Take 1 tablet by mouth daily.   olmesartan-hydrochlorothiazide (BENICAR HCT) 20-12.5 MG tablet Take 1 tablet by mouth daily. Last Thursday.   pregabalin (LYRICA) 100 MG capsule Take 100 mg by mouth 3 (three) times daily.   tirzepatide (MOUNJARO) 7.5 MG/0.5ML Pen Inject 0.5 mLs into the skin once a week. (Patient taking differently: Inject 10 mLs into the skin once a week.)   traZODone (DESYREL) 50 MG tablet Take 50 mg by mouth at bedtime.   [DISCONTINUED] topiramate  (TOPAMAX ) 25 MG tablet Take 2 tablets (50 mg total) by mouth daily.   amlodipine-atorvastatin (CADUET) 10-10 MG tablet Take 1 tablet by mouth daily. Last dose: Thursday. (Patient not taking: Reported on 05/17/2023)   cariprazine (VRAYLAR) capsule Take by mouth. (Patient not taking: Reported on  05/17/2023)   fluvoxaMINE (LUVOX) 50 MG tablet Take 50 mg by mouth daily. (Patient not taking: Reported on 05/17/2023)   [DISCONTINUED] amoxicillin  (AMOXIL ) 500 MG capsule Take 1 capsule (500 mg total) by mouth 3 (three) times daily. (Patient not taking: Reported on 05/17/2023)   [DISCONTINUED] chlorhexidine  (PERIDEX) 0.12 % solution Use as directed 5 mLs in the mouth or throat 2 (two) times daily. (Patient not taking: Reported on 05/17/2023)   [DISCONTINUED] cyclobenzaprine  (FLEXERIL ) 10 MG tablet Take 1 tablet (10 mg total) by mouth 2 (two) times daily as needed for muscle spasms. (Patient not taking: Reported on 05/17/2023)   [DISCONTINUED] docusate sodium (COLACE) 100 MG capsule Take 100 mg by mouth daily. (Patient not taking: Reported on 08/01/2023)   [DISCONTINUED] glipiZIDE (GLUCOTROL) 5 MG tablet Take 5 mg by mouth daily. (Patient not taking: Reported on 05/17/2023)   [DISCONTINUED] hydrOXYzine (ATARAX/VISTARIL) 10 MG tablet Take 10 mg by mouth 3 (three) times daily as needed. (Patient not taking: Reported on 05/17/2023)   [DISCONTINUED] ibuprofen (ADVIL) 800 MG tablet Take 1 tablet by mouth every 8 (eight) hours as needed.   [DISCONTINUED] insulin  degludec (TRESIBA FLEXTOUCH) 200 UNIT/ML FlexTouch Pen Inject 40 Units into the skin daily at 6 (six) AM.   [DISCONTINUED] insulin  detemir (LEVEMIR FLEXPEN) 100 UNIT/ML FlexPen Inject 40 Units into the skin daily. (Patient not taking: Reported on 05/17/2023)   [DISCONTINUED] lisinopril -hydrochlorothiazide (PRINZIDE,ZESTORETIC) 10-12.5 MG tablet Take 1 tablet by mouth daily.   [DISCONTINUED] Semaglutide, 1 MG/DOSE, (OZEMPIC, 1 MG/DOSE,) 4 MG/3ML SOPN 1 MG SUBCUTANEOUSLY WEEKLY AS DIRECTED. (Patient not taking: Reported on 05/17/2023)   No facility-administered encounter medications on file as of 12/20/2023.    PAST MEDICAL HISTORY: Past Medical History:  Diagnosis Date   Anemia    Bipolar disorder (HCC)    Dental caries    Diabetes mellitus without  complication (HCC)    Headache    HNP (herniated nucleus pulposus)    Hypertension    Impacted teeth    wisdom   Menorrhagia    Morbid obesity with BMI of 50.0-59.9, adult (HCC)    Numbness and tingling of leg    right   OCD (  obsessive compulsive disorder)    Shortness of breath dyspnea    Tourette's disorder    Wears glasses     PAST SURGICAL HISTORY: Past Surgical History:  Procedure Laterality Date   LUMBAR LAMINECTOMY/DECOMPRESSION MICRODISCECTOMY Right 02/08/2015   Procedure: Microdiscectomy - right - L5-S1 ;  Surgeon: Victory Gunnels, MD;  Location: [facility];  Service: Neurosurgery;  Laterality: Right;  Microdiscectomy - right - L5-S1    TOOTH EXTRACTION N/A 04/27/2017   Procedure: DENTAL RESTORATION/EXTRACTIONS;  Surgeon: Sheryle Hamilton, DDS;  Location: [facility] OR;  Service: Oral Surgery;  Laterality: N/A;    ALLERGIES: Allergies  Allergen Reactions   Losartan Palpitations    FAMILY HISTORY: Family History  Problem Relation Age of Onset   Cancer Mother    Diabetes Mother    Hypertension Mother    Cancer Father    Diabetes Father    Hypertension Father     SOCIAL HISTORY: Social History   Tobacco Use   Smoking status: Never   Smokeless tobacco: Never  Vaping Use   Vaping status: Never Used  Substance Use Topics   Alcohol use: No   Drug use: No   Social History   Social History Narrative   Are you right handed or left handed? Right   Are you currently employed ? no   What is your current occupation?    Do you live at home alone? no   Who lives with you? sister   What type of home do you live in: 1 story or 2 story? one    Caffiene no      Objective:  Vital Signs:  BP 125/85   Pulse 97   Ht 5' 6 (1.676 m)   Wt (!) 315 lb (142.9 kg)   SpO2 98%   BMI 50.84 kg/m   General: No acute distress.  Patient appears well-groomed.   Head:  Normocephalic/atraumatic Eyes:  fundi examined, disc margins clear, no obvious papilledema Neck: supple Heart:  regular rate and rhythm Lungs: Clear to auscultation bilaterally. Vascular: No carotid bruits.  Neurological Exam: Mental status: alert and oriented, speech fluent and not dysarthric, language intact.  Cranial nerves: CN I: not tested CN II: pupils equal, round and reactive to light, visual fields intact CN III, IV, VI:  full range of motion, no nystagmus, no ptosis CN V: facial sensation intact. CN VII: upper and lower face symmetric CN VIII: hearing intact CN IX, X: uvula midline CN XI: sternocleidomastoid and trapezius muscles intact CN XII: tongue midline  Bulk & Tone: normal, no fasciculations. Motor:  muscle strength 5/5 throughout Deep Tendon Reflexes:  2+ throughout.   Sensation:  Pinprick sensation appears intact Finger to nose testing:  Without dysmetria.   Gait:  Normal station and stride.  Romberg negative.  When patient lays on exam table, head pressure goes away in seconds. When sitting back up, pressure returns within 20 seconds.  Labs and Imaging review: New results: Lumbar puncture (12/10/23): Opening pressure 21 cm of water 1 RBC, 1 WBC, 51 protein, 70 glucose  Previously reviewed results: 05/17/23: B12: 491 TSH wnl HbA1c: 8.6   03/04/23: CMP significant for glucose 210 CBC w/ diff unremarkable   HbA1c (03/12/2017): 13.4   Imaging/Procedures: CT head wo contrast (03/04/23): FINDINGS: Brain: No evidence of acute infarction, hemorrhage, hydrocephalus, extra-axial collection or mass lesion/mass effect.   Vascular: No hyperdense vessel or unexpected calcification.   Skull: Normal. Negative for fracture or focal lesion.   Sinuses/Orbits: Symmetric thickening of the  adenoids. The mastoids and sinuses are clear.   IMPRESSION: Unremarkable appearance of the brain.   Lumbar spine xray (02/08/15): FINDINGS: Cross-table lateral radiograph of the lumbosacral spine demonstrates a metallic probe positioned at the L5-S1 interspace posteriorly. Vertebral  bodies are labeled. Soft tissue spreaders are located posterior to the lumbosacral junction.   IMPRESSION: Intraoperative localization as above.   MRI lumbar spine wo contrast (12/23/14): FINDINGS: Normal signal is present in the conus medullaris which terminates at L1. Marrow signal, vertebral body heights, and alignment are normal.   Limited imaging of the abdomen is within normal limits. There is no significant adenopathy.   The disc levels at L3-4 and above are normal.   L4-5: A central disc protrusion is present. This results in mild subarticular narrowing bilaterally. The foramina are patent.   L5-S1: A prominent central disc extrusion is present. Cephalo caudad dimension of the extrusion is 15 mm. Moderate right and mild left subarticular stenosis is present. This likely impacts the right S1 nerve root. The foramina are patent.   IMPRESSION: 1. Prominent right paracentral disc extrusion at L5-S1 with moderate right and mild left subarticular narrowing at L5-S1, likely affecting the right S1 nerve root. 2. Mild subarticular narrowing bilaterally at L4-5 secondary to a central disc protrusion.   EMG (03/02/2017 Dr Eldonna): EMG & NCV Findings: Evaluation of the right median motor nerve showed prolonged distal onset latency (6.6 ms) and decreased conduction velocity (Elbow-Wrist, 43 m/s).  The right median (across palm) sensory nerve showed prolonged distal peak latency (Wrist, 9.2 ms), reduced amplitude (4.0 V), and prolonged distal peak latency (Palm, 3.0 ms).  All remaining nerves (as indicated in the following tables) were within normal limits.     All examined muscles (as indicated in the following table) showed no evidence of electrical instability.     Impression: The above electrodiagnostic study is ABNORMAL and reveals evidence of a moderate right median nerve entrapment at the wrist (carpal tunnel syndrome) affecting sensory and motor components.    There is no  significant electrodiagnostic evidence of any other focal nerve entrapment, brachial plexopathy or cervical radiculopathy.   MRI lumbar spine w/wo contrast (06/24/23): FINDINGS: Segmentation:  Standard.   Alignment:  Physiologic.   Vertebrae: No fracture, evidence of discitis, or bone lesion. Degenerative fatty marrow conversion at L5-S1 endplates.   Conus medullaris and cauda equina: Conus extends to the L1 level. Conus and cauda equina appear normal.   Paraspinal and other soft tissues: Expected postoperative scarring and fatty atrophy at prior posterior decompression.   Disc levels:   L4-5: Mild disc desiccation and narrowing with central protrusion that is chronic, no static nerve root compression. Mild degenerative facet spurring.   L5-S1: Disc desiccation and narrowing with chronic endplate degeneration. Disc protrusion with prior microdiscectomy via right laminotomy. Residual left para median herniation could affect the left S1 nerve root. Bulging disc causes noncompressive/mild right foraminal narrowing.   IMPRESSION: Compared to 2016, mild progression of disc degeneration at L5-S1 with chronic herniation post micro discectomy on the right. Increased left paramedian herniation could affect the left S1 nerve root at the subarticular recess.   L4-5 chronic noncompressive central disc protrusion.  Assessment/Plan:  This is Carolyn Ingram, a 28 y.o. female with: Head pressure - it is present when standing or sitting and resolves quickly when laying flat. LP done when there was concern for IIH showed an opening pressure of 21 cm of water. She has noticed no change on Topamax  50 mg  daily. Symptoms sound more consistent with intracranial hypotension today (?CSF leak). I will have her stop topamax  to see if this changes symptoms. I will also investigate further for evidence of low CSF pressure or CSF leak. Right leg pain/tightness - unclear etiology. Unclear if this is related  to head pressure but perhaps not. Her MRI lumbar spine showed potential compression of left S1 root but this would not explain right thigh symptoms. Examination of right leg is normal.   Plan: -MRI brain, cervical, and thoracic spine w/wo contrast -Could consider EMG for right leg -See [facility] as planned -Reduce Topamax  to 25 mg daily for 1 week, then stop -Patient will call with significant changes in symptoms  Return to clinic in 5 months  Total time spent reviewing records, interview, history/exam, documentation, and coordination of care on day of encounter:  35 min  Venetia Potters, MD

## 2023-12-15 LAB — GENECONNECT MOLECULAR SCREEN: Genetic Analysis Overall Interpretation: NEGATIVE

## 2023-12-20 ENCOUNTER — Encounter: Payer: Self-pay | Admitting: Neurology

## 2023-12-20 ENCOUNTER — Ambulatory Visit (INDEPENDENT_AMBULATORY_CARE_PROVIDER_SITE_OTHER): Admitting: Neurology

## 2023-12-20 VITALS — BP 125/85 | HR 97 | Ht 66.0 in | Wt 315.0 lb

## 2023-12-20 DIAGNOSIS — R519 Headache, unspecified: Secondary | ICD-10-CM

## 2023-12-20 DIAGNOSIS — G9600 Cerebrospinal fluid leak, unspecified: Secondary | ICD-10-CM | POA: Diagnosis not present

## 2023-12-20 DIAGNOSIS — M79604 Pain in right leg: Secondary | ICD-10-CM

## 2023-12-20 NOTE — Patient Instructions (Signed)
 I now wonder if your head pressure is due to leaking of spinal fluid causing the brain to sag and cause pain since it only happens when you sit up or stand and goes away when laying down.  I want to get MRI of your brain and spinal cord to see if there is evidence of the brain sagging or leak of spinal fluid.  See [facility] as planned next year.  Reduce your topamax  to 25 mg daily (1 tablet) for 1 week then stop the medication.  Please let me know if you have any significant change in the head pressure or changes in vision.  I will be in touch when I have the results of your MRIs.  Please let me know if you have any questions or concerns in the meantime.  I will see you back in about 5 months after you see [facility].  The physicians and staff at [facility] are committed to providing excellent care. You may receive a survey requesting feedback about your experience at our office. We strive to receive very good responses to the survey questions. If you feel that your experience would prevent you from giving the office a very good  response, please contact our office to try to remedy the situation. We may be reached at [phone]. Thank you for taking the time out of your busy day to complete the survey.  Venetia Potters, MD [facility]

## 2024-01-22 ENCOUNTER — Ambulatory Visit
Admission: RE | Admit: 2024-01-22 | Discharge: 2024-01-22 | Disposition: A | Discharge status: Home / Self Care | Source: Ambulatory Visit | Attending: Neurology | Admitting: Neurology

## 2024-01-22 DIAGNOSIS — R519 Headache, unspecified: Secondary | ICD-10-CM

## 2024-01-22 DIAGNOSIS — G9600 Cerebrospinal fluid leak, unspecified: Secondary | ICD-10-CM

## 2024-01-22 MED ORDER — GADOPICLENOL 0.5 MMOL/ML IV SOLN
10.0000 mL | Freq: Once | INTRAVENOUS | Status: AC | PRN
  Administered 2024-01-22: 10 mL via INTRAVENOUS

## 2024-01-23 ENCOUNTER — Ambulatory Visit
Admission: RE | Admit: 2024-01-23 | Discharge: 2024-01-23 | Disposition: A | Discharge status: Home / Self Care | Source: Ambulatory Visit | Attending: Neurology | Admitting: Neurology

## 2024-01-23 DIAGNOSIS — G9600 Cerebrospinal fluid leak, unspecified: Secondary | ICD-10-CM

## 2024-01-23 DIAGNOSIS — R519 Headache, unspecified: Secondary | ICD-10-CM

## 2024-01-23 MED ORDER — GADOPICLENOL 0.5 MMOL/ML IV SOLN
10.0000 mL | Freq: Once | INTRAVENOUS | Status: AC | PRN
  Administered 2024-01-23: 10 mL via INTRAVENOUS

## 2024-01-30 ENCOUNTER — Telehealth: Payer: Self-pay | Admitting: Neurology

## 2024-01-30 NOTE — Telephone Encounter (Signed)
 Called patient to discuss results of MRI brain, cervical, and thoracic spine. These were unremarkable with no clear evidence of CSF leak.  She continues to have head pressure only when sitting up that resolves when laying down. Stopping the topamax  has not significantly changed her symptoms. Given this presentation of headache and concern for CSF leak, I explained that I would like patient to see a neurosurgery specialist to get their opinion on this possibility. Patient agreed. She will also follow up with me as planned.  All questions were answered.  Venetia Potters, MD [facility]

## 2024-01-31 ENCOUNTER — Other Ambulatory Visit: Payer: Self-pay

## 2024-01-31 DIAGNOSIS — R519 Headache, unspecified: Secondary | ICD-10-CM

## 2024-01-31 DIAGNOSIS — G9600 Cerebrospinal fluid leak, unspecified: Secondary | ICD-10-CM

## 2024-01-31 DIAGNOSIS — H538 Other visual disturbances: Secondary | ICD-10-CM

## 2024-02-08 ENCOUNTER — Ambulatory Visit: Admitting: Neurosurgery

## 2024-02-12 ENCOUNTER — Encounter: Payer: Self-pay | Admitting: Neurosurgery

## 2024-02-12 ENCOUNTER — Ambulatory Visit: Admitting: Neurosurgery

## 2024-02-12 VITALS — BP 145/100 | HR 79 | Temp 98.7°F | Ht 66.0 in | Wt 318.6 lb

## 2024-02-12 DIAGNOSIS — R9089 Other abnormal findings on diagnostic imaging of central nervous system: Secondary | ICD-10-CM

## 2024-02-12 DIAGNOSIS — H538 Other visual disturbances: Secondary | ICD-10-CM

## 2024-02-12 DIAGNOSIS — R519 Headache, unspecified: Secondary | ICD-10-CM | POA: Diagnosis not present

## 2024-02-12 DIAGNOSIS — D352 Benign neoplasm of pituitary gland: Secondary | ICD-10-CM

## 2024-02-12 NOTE — Progress Notes (Signed)
 Assessment : Discussed the use of AI scribe software for clinical note transcription with the patient, who gave verbal consent to proceed.  History of Present Illness Carolyn Ingram is a 28 year old female with diabetes and hypertension who presents with persistent head pressure and vision changes. She was referred by Dr. Leigh for evaluation of persistent head pressure and vision changes.  She has been experiencing persistent head pressure since November of last year, described as a constant heaviness, 'like something just sitting on top of my head.' The pressure improves when lying on her back but worsens when sitting up or leaning forward. Topamax  has not alleviated the pressure, and a spinal tap did not result in significant change.  Over the past year, she has experienced worsening vision, characterized by episodes of blurriness and difficulty maintaining balance. Bright lights exacerbate her symptoms, prompting her to use blackout curtains and take showers in the dark. An eye examination in January revealed no abnormalities.  She experiences numbness in her leg after walking for more than five minutes, which began around the same time as the head pressure. She reports that MRIs were performed and she was told by Dr. Leigh that nothing was found.  Her sleep is disrupted by the head pressure, often keeping her awake at night. No snoring or waking up gasping for air, according to her bed partner.  She works in [employer], which involves standing, walking, and heavy lifting. She has not experienced any significant weight changes over the past year, but notes a return to regular menstrual cycles after a period of amenorrhea.    Plan : This is a confusing picture for sure: Given her habitus and presentation with headaches and blurry vision, I would definitely expect her to have idiopathic intracranial hypertension.  The factors that align for this are not only that but the fact that she  has headaches all night long and complains of blurry vision which has been getting worse.  She says that she saw somebody at [facility] in January [11 months ago] and they told her that her eyes were good.  I am not entirely sure whether any funduscopy was done. What pleads against this is the fact that she says that when she lays down her headaches get better and the lumbar puncture that was done which demonstrated an opening pressure of 21 cm, 15 cc was taken off, did not make her headache any better, not even for a day.  Additionally, the MRI does not show an empty sella nor any Chiari.  The alternative is the opposite, intracranial hypotension.  This would indeed be the case with the story that laying down makes her headaches better and sitting up makes them worse.  However, the MRI does not show any findings that would support an intracranial hypotension [there is no dural enhancement, no flattening of the pons, no Chiari/tonsillar descent], a lumbar puncture that demonstrates an opening pressure of 21 cm [this was done with her laying on her abdomen but the pressure measurements were taken with her on the side] and taking CSF off did not make her headaches any worse.  Lowering the Topamax  did not make her headaches any better either.  All in all, this remains a conundrum of of why she is having what she has.  I think what is going to be very helpful is the exam that she is going to have with Dr. Octavia in February and hopefully if we do or do not see  papilledema it will help us  with getting some clarity.  I also reviewed her MRI and on the coronal enhanced sequences there is a picture of a hypodensity in the pituitary gland [there were no sagittal sequences with contrast done].  It is very well possible that she in addition to all this, has a pituitary tumor as well.  I would like to get hormonal lab work done and have requested a pituitary MRI.  I hope to see her back after she has been evaluated by  the ophthalmologist team and she pledged to bring the report with her.     Social History   Socioeconomic History   Marital status: Single    Spouse name: Not on file   Number of children: Not on file   Years of education: Not on file   Highest education level: Not on file  Occupational History   Not on file  Tobacco Use   Smoking status: Never   Smokeless tobacco: Never  Vaping Use   Vaping status: Never Used  Substance and Sexual Activity   Alcohol use: No   Drug use: No   Sexual activity: Not Currently  Other Topics Concern   Not on file  Social History Narrative   Are you right handed or left handed? Right   Are you currently employed ? no   What is your current occupation?    Do you live at home alone? no   Who lives with you? sister   What type of home do you live in: 1 story or 2 story? one    Caffiene no   Social Drivers of Health   Tobacco Use: Low Risk (02/12/2024)   Patient History    Smoking Tobacco Use: Never    Smokeless Tobacco Use: Never    Passive Exposure: Not on file  Financial Resource Strain: Not on file  Food Insecurity: Not on file  Transportation Needs: Not on file  Physical Activity: Not on file  Stress: Not on file  Social Connections: Not on file  Intimate Partner Violence: Not on file  Depression (EYV7-0): Not on file  Alcohol Screen: Not on file  Housing: Not on file  Utilities: Not on file  Health Literacy: Not on file    Family History  Problem Relation Age of Onset   Cancer Mother    Diabetes Mother    Hypertension Mother    Cancer Father    Diabetes Father    Hypertension Father     Allergies[1]  Past Medical History:  Diagnosis Date   Anemia    Bipolar disorder (HCC)    Dental caries    Diabetes mellitus without complication (HCC)    Headache    HNP (herniated nucleus pulposus)    Hypertension    Impacted teeth    wisdom   Menorrhagia    Morbid obesity with BMI of 50.0-59.9, adult (HCC)    Numbness and  tingling of leg    right   OCD (obsessive compulsive disorder)    Shortness of breath dyspnea    Tourette's disorder    Wears glasses     Past Surgical History:  Procedure Laterality Date   LUMBAR LAMINECTOMY/DECOMPRESSION MICRODISCECTOMY Right 02/08/2015   Procedure: Microdiscectomy - right - L5-S1 ;  Surgeon: Victory Gunnels, MD;  Location: [facility];  Service: Neurosurgery;  Laterality: Right;  Microdiscectomy - right - L5-S1    TOOTH EXTRACTION N/A 04/27/2017   Procedure: DENTAL RESTORATION/EXTRACTIONS;  Surgeon: Sheryle Hamilton, DDS;  Location: [facility];  Service: Oral Surgery;  Laterality: N/A;     Physical Exam   Physical Exam HENT:     Head: Normocephalic.     Nose: Nose normal.  Eyes:     Pupils: Pupils are equal, round, and reactive to light.  Cardiovascular:     Rate and Rhythm: Normal rate.  Pulmonary:     Effort: Pulmonary effort is normal.  Abdominal:     General: Abdomen is flat.  Musculoskeletal:     Cervical back: Normal range of motion.  Neurological:     Mental Status: Patient is alert.     Cranial Nerves: Cranial nerves 2-12 are intact.     Sensory: Sensation is intact.     Motor: Motor function is intact.     Coordination: Coordination is intact.     Results for orders placed or performed during the hospital encounter of 01/23/24  MR BRAIN W WO CONTRAST   Narrative   EXAM: MRI BRAIN WITH AND WITHOUT CONTRAST 01/23/2024 10:46:10 AM  TECHNIQUE: Multiplanar multisequence MRI of the head/brain was performed with and without the administration of intravenous contrast.  COMPARISON: CT of the head dated 03/04/2023.  CLINICAL HISTORY: Pressure head, CSF leak, heaviness, tightness for 1 year.  FINDINGS:  BRAIN AND VENTRICLES: No acute infarct. No acute intracranial hemorrhage. No mass effect or midline shift. No hydrocephalus. The sella is unremarkable. Normal flow voids. No mass or abnormal enhancement.  ORBITS: No acute  abnormality.  SINUSES: No acute abnormality.  BONES AND SOFT TISSUES: Normal bone marrow signal and enhancement. No acute soft tissue abnormality.  IMPRESSION: 1. No acute intracranial abnormality. 2. No mass or abnormal enhancement.  Electronically signed by: Evalene Coho MD 01/30/2024 10:21 AM EST RP Workstation: [id number]   Results for orders placed or performed during the hospital encounter of 03/04/23  CT Head Wo Contrast   Narrative   CLINICAL DATA:  Intermittent headache for 2 weeks  EXAM: CT HEAD WITHOUT CONTRAST  TECHNIQUE: Contiguous axial images were obtained from the base of the skull through the vertex without intravenous contrast.  RADIATION DOSE REDUCTION: This exam was performed according to the departmental dose-optimization program which includes automated exposure control, adjustment of the mA and/or kV according to patient size and/or use of iterative reconstruction technique.  COMPARISON:  None Available.  FINDINGS: Brain: No evidence of acute infarction, hemorrhage, hydrocephalus, extra-axial collection or mass lesion/mass effect.  Vascular: No hyperdense vessel or unexpected calcification.  Skull: Normal. Negative for fracture or focal lesion.  Sinuses/Orbits: Symmetric thickening of the adenoids. The mastoids and sinuses are clear.  IMPRESSION: Unremarkable appearance of the brain.   Electronically Signed   By: Dorn Roulette M.D.   On: 03/04/2023 08:20        [1]  Allergies Allergen Reactions   Losartan Palpitations

## 2024-02-15 ENCOUNTER — Ambulatory Visit: Admitting: Neurosurgery

## 2024-02-16 LAB — INSULIN-LIKE GROWTH FACTOR
IGF-I, LC/MS: 66 ng/mL (ref 53–331)
Z-Score (Female): -1.7 {STDV}

## 2024-02-16 LAB — CBC
HCT: 38.2 % (ref 35.9–46.0)
Hemoglobin: 12.4 g/dL (ref 11.7–15.5)
MCH: 28.2 pg (ref 27.0–33.0)
MCHC: 32.5 g/dL (ref 31.6–35.4)
MCV: 87 fL (ref 81.4–101.7)
MPV: 10 fL (ref 7.5–12.5)
Platelets: 356 Thousand/uL (ref 140–400)
RBC: 4.39 Million/uL (ref 3.80–5.10)
RDW: 13.1 % (ref 11.0–15.0)
WBC: 9.3 Thousand/uL (ref 3.8–10.8)

## 2024-02-16 LAB — BASIC METABOLIC PANEL WITH GFR
BUN/Creatinine Ratio: 16 (calc) (ref 6–22)
BUN: 7 mg/dL (ref 7–25)
CO2: 29 mmol/L (ref 20–32)
Calcium: 9.7 mg/dL (ref 8.6–10.2)
Chloride: 100 mmol/L (ref 98–110)
Creat: 0.45 mg/dL — ABNORMAL LOW (ref 0.50–0.97)
Glucose, Bld: 183 mg/dL — ABNORMAL HIGH (ref 65–99)
Potassium: 4.3 mmol/L (ref 3.5–5.3)
Sodium: 138 mmol/L (ref 135–146)
eGFR: 133 mL/min/1.73m2 (ref >=60)

## 2024-02-16 LAB — TSH+FREE T4: TSH W/REFLEX TO FT4: 2.72 m[IU]/L

## 2024-02-16 LAB — PROLACTIN: Prolactin: 6.5 ng/mL

## 2024-02-16 LAB — FSH/LH
FSH: 5.4 m[IU]/mL
LH: 2.9 m[IU]/mL

## 2024-02-16 LAB — CORTISOL: Cortisol, Plasma: 10.1 ug/dL

## 2024-03-05 ENCOUNTER — Ambulatory Visit (HOSPITAL_COMMUNITY)
Admission: RE | Admit: 2024-03-05 | Discharge: 2024-03-05 | Disposition: A | Discharge status: Home / Self Care | Source: Ambulatory Visit | Attending: Neurosurgery | Admitting: Neurosurgery

## 2024-03-05 DIAGNOSIS — D352 Benign neoplasm of pituitary gland: Secondary | ICD-10-CM

## 2024-03-05 MED ORDER — GADOBUTROL 1 MMOL/ML IV SOLN
10.0000 mL | Freq: Once | INTRAVENOUS | Status: AC | PRN
  Administered 2024-03-05: 10 mL via INTRAVENOUS

## 2024-04-22 ENCOUNTER — Ambulatory Visit: Admitting: Neurosurgery

## 2024-07-02 ENCOUNTER — Ambulatory Visit: Admitting: Neurology
